# Patient Record
Sex: Female | Born: 1970 | Race: Black or African American | Hispanic: No | Marital: Single | Smoking: Never smoker
Health system: Southern US, Community
[De-identification: ages and names within clinical notes are randomized; demographics above are authoritative.]

## PROBLEM LIST (undated history)

## (undated) DIAGNOSIS — I10 Essential (primary) hypertension: Secondary | ICD-10-CM

## (undated) DIAGNOSIS — C539 Malignant neoplasm of cervix uteri, unspecified: Secondary | ICD-10-CM

---

## 2011-06-27 DIAGNOSIS — I10 Essential (primary) hypertension: Secondary | ICD-10-CM | POA: Diagnosis present

## 2011-06-27 DIAGNOSIS — G4733 Obstructive sleep apnea (adult) (pediatric): Secondary | ICD-10-CM | POA: Diagnosis present

## 2011-07-01 DIAGNOSIS — F419 Anxiety disorder, unspecified: Secondary | ICD-10-CM | POA: Insufficient documentation

## 2012-06-25 DIAGNOSIS — D649 Anemia, unspecified: Secondary | ICD-10-CM | POA: Diagnosis present

## 2012-07-20 DIAGNOSIS — F32A Depression, unspecified: Secondary | ICD-10-CM | POA: Diagnosis present

## 2018-08-18 DIAGNOSIS — Z8541 Personal history of malignant neoplasm of cervix uteri: Secondary | ICD-10-CM | POA: Insufficient documentation

## 2020-03-09 DIAGNOSIS — N182 Chronic kidney disease, stage 2 (mild): Secondary | ICD-10-CM | POA: Diagnosis present

## 2021-11-04 ENCOUNTER — Emergency Department: Payer: Self-pay

## 2021-11-04 ENCOUNTER — Emergency Department
Admission: EM | Admit: 2021-11-04 | Discharge: 2021-11-04 | Disposition: A | Discharge status: Home / Self Care | Payer: Self-pay | Attending: Emergency Medicine | Admitting: Emergency Medicine

## 2021-11-04 ENCOUNTER — Other Ambulatory Visit: Payer: Self-pay

## 2021-11-04 DIAGNOSIS — R600 Localized edema: Secondary | ICD-10-CM | POA: Insufficient documentation

## 2021-11-04 DIAGNOSIS — R609 Edema, unspecified: Secondary | ICD-10-CM

## 2021-11-04 DIAGNOSIS — I10 Essential (primary) hypertension: Secondary | ICD-10-CM | POA: Insufficient documentation

## 2021-11-04 DIAGNOSIS — Z79899 Other long term (current) drug therapy: Secondary | ICD-10-CM | POA: Insufficient documentation

## 2021-11-04 DIAGNOSIS — M7989 Other specified soft tissue disorders: Secondary | ICD-10-CM | POA: Insufficient documentation

## 2021-11-04 HISTORY — DX: Essential (primary) hypertension: I10

## 2021-11-04 LAB — CBC WITH DIFFERENTIAL/PLATELET
Abs Immature Granulocytes: 0.01 10*3/uL (ref 0.00–0.07)
Basophils Absolute: 0 10*3/uL (ref 0.0–0.1)
Basophils Relative: 0 %
Eosinophils Absolute: 0.2 10*3/uL (ref 0.0–0.5)
Eosinophils Relative: 3 %
HCT: 34.8 % — ABNORMAL LOW (ref 36.0–46.0)
Hemoglobin: 11.9 g/dL — ABNORMAL LOW (ref 12.0–15.0)
Immature Granulocytes: 0 %
Lymphocytes Relative: 45 %
Lymphs Abs: 2.9 10*3/uL (ref 0.7–4.0)
MCH: 30.9 pg (ref 26.0–34.0)
MCHC: 34.2 g/dL (ref 30.0–36.0)
MCV: 90.4 fL (ref 80.0–100.0)
Monocytes Absolute: 0.3 10*3/uL (ref 0.1–1.0)
Monocytes Relative: 5 %
Neutro Abs: 3 10*3/uL (ref 1.7–7.7)
Neutrophils Relative %: 47 %
Platelets: 241 10*3/uL (ref 150–400)
RBC: 3.85 MIL/uL — ABNORMAL LOW (ref 3.87–5.11)
RDW: 13.3 % (ref 11.5–15.5)
WBC: 6.4 10*3/uL (ref 4.0–10.5)
nRBC: 0 % (ref 0.0–0.2)

## 2021-11-04 LAB — URINALYSIS, COMPLETE (UACMP) WITH MICROSCOPIC
Bacteria, UA: NONE SEEN
Bilirubin Urine: NEGATIVE
Glucose, UA: NEGATIVE mg/dL
Hgb urine dipstick: NEGATIVE
Ketones, ur: NEGATIVE mg/dL
Nitrite: NEGATIVE
Protein, ur: NEGATIVE mg/dL
Specific Gravity, Urine: 1.024 (ref 1.005–1.030)
pH: 5 (ref 5.0–8.0)

## 2021-11-04 LAB — COMPREHENSIVE METABOLIC PANEL
ALT: 9 U/L (ref 0–44)
AST: 15 U/L (ref 15–41)
Albumin: 3.6 g/dL (ref 3.5–5.0)
Alkaline Phosphatase: 89 U/L (ref 38–126)
Anion gap: 6 (ref 5–15)
BUN: 17 mg/dL (ref 6–20)
CO2: 27 mmol/L (ref 22–32)
Calcium: 8.8 mg/dL — ABNORMAL LOW (ref 8.9–10.3)
Chloride: 106 mmol/L (ref 98–111)
Creatinine, Ser: 0.88 mg/dL (ref 0.44–1.00)
GFR, Estimated: >60 mL/min (ref >60)
Glucose, Bld: 92 mg/dL (ref 70–99)
Potassium: 3.5 mmol/L (ref 3.5–5.1)
Sodium: 139 mmol/L (ref 135–145)
Total Bilirubin: 0.7 mg/dL (ref 0.3–1.2)
Total Protein: 7.3 g/dL (ref 6.5–8.1)

## 2021-11-04 NOTE — ED Notes (Signed)
Increased swelling of the R leg  to thigh vs L leg for approx, 1 week. Pain in the R leg but denies pain L leg. Pt does take Lasix on a regular basis. Did not take any Lasix today. R leg stiffness with walking. No previous hx of DVT. Denies SOB/CP.  A&Ox4. Skin p/w/d. RR even and nonlabored.

## 2021-11-04 NOTE — ED Triage Notes (Signed)
Pt c/o right leg pain and swelling for the past 3 days, denies injury, pt is concerned for a DVT

## 2021-11-04 NOTE — ED Notes (Signed)
Pt left without receiving discharge papers, no notification to staff that she was leaving.

## 2021-11-04 NOTE — ED Provider Notes (Addendum)
[facility] Provider Note  Patient Contact: 8:53 PM (approximate)   History   Leg Pain   HPI  Cassandra Russo is a 51 y.o. female with a history of essential hypertension presents to the emergency department with right lower extremity edema.  Patient states that she has chronic swelling of the bilateral lower extremities and does take a diuretic at home.  Patient has been cautioned that should she experience worsening swelling as she currently has to double her Lasix at home.  Patient states that she has not increased her dosage of Lasix because she does not like to have to urinate often.  She denies falls or mechanisms of trauma.  She denies chest pain, chest tightness, shortness of breath.  No orthopnea or paroxysmal nocturnal dyspnea.      Physical Exam   Triage Vital Signs: ED Triage Vitals  Enc Vitals Group     BP 11/04/21 1856 (!) 188/105     Pulse Rate 11/04/21 1856 79     Resp 11/04/21 1856 19     Temp 11/04/21 1856 98.7 F (37.1 C)     Temp Source 11/04/21 2013 Oral     SpO2 11/04/21 1856 97 %     Weight --      Height --      Head Circumference --      Peak Flow --      Pain Score 11/04/21 2013 4     Pain Loc --      Pain Edu? --      Excl. in [place]? --     Most recent vital signs: Vitals:   11/04/21 1856 11/04/21 2013  BP: (!) 188/105 (!) 162/104  Pulse: 79 78  Resp: 19 18  Temp: 98.7 F (37.1 C) 98.6 F (37 C)  SpO2: 97% 99%     General: Alert and in no acute distress. Eyes:  PERRL. EOMI. Head: No acute traumatic findings ENT:      Nose: No congestion/rhinnorhea.      Mouth/Throat: Mucous membranes are moist. Neck: No stridor. No cervical spine tenderness to palpation. Hematological/Lymphatic/Immunilogical: No cervical lymphadenopathy. Cardiovascular:  Good peripheral perfusion Respiratory: Normal respiratory effort without tachypnea or retractions. Lungs CTAB. Good air entry to the bases with no decreased or absent breath  sounds. Gastrointestinal: Bowel sounds 4 quadrants. Soft and nontender to palpation. No guarding or rigidity. No palpable masses. No distention. No CVA tenderness. Musculoskeletal: Full range of motion to all extremities.  Neurologic:  No gross focal neurologic deficits are appreciated.  Skin:   No rash noted Other:   ED Results / Procedures / Treatments   Labs (all labs ordered are listed, but only abnormal results are displayed) Labs Reviewed  CBC WITH DIFFERENTIAL/PLATELET - Abnormal; Notable for the following components:      Result Value   RBC 3.85 (*)    Hemoglobin 11.9 (*)    HCT 34.8 (*)    All other components within normal limits  COMPREHENSIVE METABOLIC PANEL - Abnormal; Notable for the following components:   Calcium 8.8 (*)    All other components within normal limits  URINALYSIS, COMPLETE (UACMP) WITH MICROSCOPIC - Abnormal; Notable for the following components:   Color, Urine YELLOW (*)    APPearance CLEAR (*)    Leukocytes,Ua SMALL (*)    All other components within normal limits        RADIOLOGY  I personally viewed and evaluated these images as part of my medical decision making,  as well as reviewing the written report by the radiologist.  ED Provider Interpretation: No evidence of DVT in the bilateral lower extremities.       MEDICATIONS ORDERED IN ED: Medications - No data to display   IMPRESSION / MDM / [place] / ED COURSE  I reviewed the triage vital signs and the nursing notes.                              Assessment and Plan:   Differential diagnosis includes, but is not limited to, DVT, acute kidney injury, venous insufficiency, medication side effect,...  51 year old female presents to the emergency department with bilateral lower extremity swelling, right worse than left for the past week.  Patient was hypertensive at triage but vital signs were otherwise reassuring.  Patient had 2+ pitting edema on the right and 1+  pitting edema on the left.  Patient had no erythema.  Venous ultrasound showed no signs of DVT.  CBC and CMP were reassuring.  Recommended increasing diuretic as directed by nephrology.  Last note from nephrology on 04/05/2021 with concern lower extremity edema stemmed from amlodipine, salty food intake and caffeine.  Return precautions were given to return to the emergency department with new or worsening symptoms.  All patient questions were answered.   FINAL CLINICAL IMPRESSION(S) / ED DIAGNOSES   Final diagnoses:  Leg swelling  Peripheral edema     Rx / DC Orders   ED Discharge Orders     None        Note:  This document was prepared using Dragon voice recognition software and may include unintentional dictation errors.   Vallarie Mare [place], PA-C 11/04/21 2244    Lannie Fields, PA-C 11/20/21 1537    Nena Polio, MD 11/21/21 [phone]

## 2022-02-05 ENCOUNTER — Emergency Department
Admission: EM | Admit: 2022-02-05 | Discharge: 2022-02-05 | Disposition: A | Discharge status: Home / Self Care | Payer: Self-pay | Attending: Emergency Medicine | Admitting: Emergency Medicine

## 2022-02-05 ENCOUNTER — Encounter: Payer: Self-pay | Admitting: Emergency Medicine

## 2022-02-05 ENCOUNTER — Emergency Department: Payer: Self-pay

## 2022-02-05 DIAGNOSIS — M79604 Pain in right leg: Secondary | ICD-10-CM | POA: Insufficient documentation

## 2022-02-05 MED ORDER — CLONIDINE HCL 0.1 MG PO TABS
0.1000 mg | ORAL_TABLET | Freq: Once | ORAL | Status: AC
  Administered 2022-02-05: 0.1 mg via ORAL
  Filled 2022-02-05: qty 1

## 2022-02-05 MED ORDER — NAPROXEN 500 MG PO TABS
500.0000 mg | ORAL_TABLET | Freq: Once | ORAL | Status: AC
  Administered 2022-02-05: 500 mg via ORAL
  Filled 2022-02-05: qty 1

## 2022-02-05 MED ORDER — NAPROXEN 500 MG PO TABS: 500.0000 mg | ORAL_TABLET | Freq: Two times a day (BID) | ORAL | 0 refills | Status: DC

## 2022-02-05 NOTE — Discharge Instructions (Signed)
Your x-ray of the right knee and ultrasound of the right leg do not show any acute issues such as fracture or blood clot in the leg.  This appears to be due to chronic degenerative changes of the joint itself and surrounding connective tissues.  Please follow-up with orthopedics for further evaluation. ?

## 2022-02-05 NOTE — ED Triage Notes (Addendum)
Pt ambulatory in triage complaining of right knee pain starting about a week ago. She notes swelling to her legs all day. Of note, the pt is prescribed diuretics but isn't compliant. Denies SOB or CP. ?

## 2022-02-05 NOTE — ED Notes (Addendum)
Pt has a hx of HTN and she took her BP medications PTA. ?

## 2022-02-05 NOTE — ED Provider Notes (Signed)
? ?[facility] ?Provider Note ? ? ? Event Date/Time  ? First MD Initiated Contact with Patient 02/05/22 0710   ?  (approximate) ? ? ?History  ? ?Leg Pain ? ? ?HPI ? ?Bryana Froemming is a 51 y.o. female who complains of right knee pain, gradual onset 1 week ago, waxing and waning, worse with standing and walking, associated with swelling of the leg.  She does note that she has been out of her blood pressure medicine and just got it refilled and restarted this morning.  Denies any chest pain or shortness of breath, no fever.  Denies any recent trauma. ?  ? ? ?Physical Exam  ? ?Triage Vital Signs: ?ED Triage Vitals  ?Enc Vitals Group  ?   BP 02/05/22 0619 (!) 196/94  ?   Pulse Rate 02/05/22 0619 74  ?   Resp 02/05/22 0619 18  ?   Temp 02/05/22 0619 98 ?F (36.7 ?C)  ?   Temp Source 02/05/22 0619 Oral  ?   SpO2 02/05/22 0619 98 %  ?   Weight 02/05/22 0617 217 lb (98.4 kg)  ?   Height 02/05/22 0617 5\' 4"  (1.626 m)  ?   Head Circumference --   ?   Peak Flow --   ?   Pain Score 02/05/22 0617 10  ?   Pain Loc --   ?   Pain Edu? --   ?   Excl. in [place]? --   ? ? ?Most recent vital signs: ?Vitals:  ? 02/05/22 0619 02/05/22 0700  ?BP: (!) 196/94 (!) 196/109  ?Pulse: 74 69  ?Resp: 18 18  ?Temp: 98 ?F (36.7 ?C)   ?SpO2: 98% 99%  ? ? ? ?General: Awake, no distress.  ?CV:  Good peripheral perfusion.  Regular rate rhythm ?Resp:  Normal effort.  Clear to auscultation bilaterally ?Abd:  No distention.  ?Other:  Symmetric calf circumference.  No calf tenderness.  Negative Homans' sign.  There is some tenderness in the popliteal fossa.  No inflammatory changes about the knee.  Full range of motion. ? ? ?ED Results / Procedures / Treatments  ? ?Labs ?(all labs ordered are listed, but only abnormal results are displayed) ?Labs Reviewed - No data to display ? ? ?EKG ? ? ? ? ?RADIOLOGY ?Knee x-ray viewed and interpreted by me, no fracture.  Radiology report reviewed. ? ?Ultrasound right leg negative for  DVT. ? ? ? ?PROCEDURES: ? ?Critical Care performed: No ? ?Procedures ? ? ?MEDICATIONS ORDERED IN ED: ?Medications  ?cloNIDine (CATAPRES) tablet 0.1 mg (0.1 mg Oral Given 02/05/22 02/07/22)  ?naproxen (NAPROSYN) tablet 500 mg (500 mg Oral Given 02/05/22 02/07/22)  ? ? ? ?IMPRESSION / MDM / ASSESSMENT AND PLAN / ED COURSE  ?I reviewed the triage vital signs and the nursing notes. ?             ?               ? ?Differential diagnosis includes, but is not limited to, fracture, arthritis, right knee sprain, right leg DVT ? ? ? ?Patient presents with right leg pain in the thigh and knee.  This is an acute issue.  No evidence of any infection.  She is nontoxic.  She is hypertensive due to medication noncompliance.  We will give a dose of clonidine for now.  She did restart her blood pressure medicine this morning so I expect blood pressure to gradually improve which is the safest course. ? ?  X-ray and ultrasound are negative.  Labs not indicated at this time.  Not requiring admission, will recommend NSAIDs for pain. ?  ? ? ?FINAL CLINICAL IMPRESSION(S) / ED DIAGNOSES  ? ?Final diagnoses:  ?Right leg pain  ? ? ? ?Rx / DC Orders  ? ?ED Discharge Orders   ? ?      Ordered  ?  naproxen (NAPROSYN) 500 MG tablet  2 times daily with meals       ? 02/05/22 0916  ? ?  ?  ? ?  ? ? ? ?Note:  This document was prepared using Dragon voice recognition software and may include unintentional dictation errors. ?  ?Sharman Cheek, MD ?02/05/22 [phone] ? ?

## 2022-08-31 ENCOUNTER — Other Ambulatory Visit: Payer: Self-pay

## 2022-08-31 DIAGNOSIS — N950 Postmenopausal bleeding: Secondary | ICD-10-CM | POA: Insufficient documentation

## 2022-08-31 DIAGNOSIS — M545 Low back pain, unspecified: Secondary | ICD-10-CM | POA: Insufficient documentation

## 2022-08-31 DIAGNOSIS — R102 Pelvic and perineal pain: Secondary | ICD-10-CM | POA: Insufficient documentation

## 2022-08-31 NOTE — ED Triage Notes (Signed)
Patient states she is having lower back pain. Patient thought is was related to gas and took dulcolax. Patient states is unrelieved with pain 10/10. No trouble voiding.

## 2022-09-01 ENCOUNTER — Emergency Department: Payer: Self-pay

## 2022-09-01 ENCOUNTER — Emergency Department
Admission: EM | Admit: 2022-09-01 | Discharge: 2022-09-01 | Disposition: A | Discharge status: Home / Self Care | Payer: Self-pay | Attending: Emergency Medicine | Admitting: Emergency Medicine

## 2022-09-01 DIAGNOSIS — N950 Postmenopausal bleeding: Secondary | ICD-10-CM

## 2022-09-01 DIAGNOSIS — R102 Pelvic and perineal pain: Secondary | ICD-10-CM

## 2022-09-01 DIAGNOSIS — M545 Low back pain, unspecified: Secondary | ICD-10-CM

## 2022-09-01 LAB — BASIC METABOLIC PANEL
Anion gap: 7 (ref 5–15)
BUN: 19 mg/dL (ref 6–20)
CO2: 25 mmol/L (ref 22–32)
Calcium: 9.3 mg/dL (ref 8.9–10.3)
Chloride: 110 mmol/L (ref 98–111)
Creatinine, Ser: 0.9 mg/dL (ref 0.44–1.00)
GFR, Estimated: >60 mL/min (ref >60)
Glucose, Bld: 99 mg/dL (ref 70–99)
Potassium: 4.1 mmol/L (ref 3.5–5.1)
Sodium: 142 mmol/L (ref 135–145)

## 2022-09-01 LAB — CBC WITH DIFFERENTIAL/PLATELET
Abs Immature Granulocytes: 0.02 10*3/uL (ref 0.00–0.07)
Basophils Absolute: 0 10*3/uL (ref 0.0–0.1)
Basophils Relative: 1 %
Eosinophils Absolute: 0.1 10*3/uL (ref 0.0–0.5)
Eosinophils Relative: 3 %
HCT: 35.3 % — ABNORMAL LOW (ref 36.0–46.0)
Hemoglobin: 11.7 g/dL — ABNORMAL LOW (ref 12.0–15.0)
Immature Granulocytes: 0 %
Lymphocytes Relative: 46 %
Lymphs Abs: 2.6 10*3/uL (ref 0.7–4.0)
MCH: 30.1 pg (ref 26.0–34.0)
MCHC: 33.1 g/dL (ref 30.0–36.0)
MCV: 90.7 fL (ref 80.0–100.0)
Monocytes Absolute: 0.4 10*3/uL (ref 0.1–1.0)
Monocytes Relative: 6 %
Neutro Abs: 2.5 10*3/uL (ref 1.7–7.7)
Neutrophils Relative %: 44 %
Platelets: 265 10*3/uL (ref 150–400)
RBC: 3.89 MIL/uL (ref 3.87–5.11)
RDW: 12.8 % (ref 11.5–15.5)
WBC: 5.7 10*3/uL (ref 4.0–10.5)
nRBC: 0 % (ref 0.0–0.2)

## 2022-09-01 MED ORDER — KETOROLAC TROMETHAMINE 30 MG/ML IJ SOLN
15.0000 mg | Freq: Once | INTRAMUSCULAR | Status: AC
  Filled 2022-09-01: qty 1

## 2022-09-01 MED ORDER — HYDROCODONE-ACETAMINOPHEN 5-325 MG PO TABS: 2.0000 | ORAL_TABLET | Freq: Four times a day (QID) | ORAL | 0 refills | Status: DC | PRN

## 2022-09-01 MED ORDER — KETOROLAC TROMETHAMINE 30 MG/ML IJ SOLN
INTRAMUSCULAR | Status: AC
  Administered 2022-09-01: 15 mg via INTRAVENOUS
  Filled 2022-09-01: qty 1

## 2022-09-01 NOTE — Discharge Instructions (Addendum)
As we discussed, your evaluation was generally reassuring.  However you do have some fluid present in the endometrial canal on ultrasound, and we feel that it is appropriate for you to follow-up with a GYN specialist or with your primary care provider.  You were given the contact information for GYN specialist at [facility] here in [place] and you can call to schedule follow-up appointment.  Explained that you are having some pelvic pain and postmenopausal vaginal bleeding, and that the emergency room physician recommended that you call to follow-up.  Try taking over-the-counter ibuprofen and/or Tylenol as needed according to label instructions for the pain.  Take Norco as prescribed for severe pain. Do not drink alcohol, drive or participate in any other potentially dangerous activities while taking this medication as it may make you sleepy. Do not take this medication with any other sedating medications, either prescription or over-the-counter. If you were prescribed Percocet or Vicodin, do not take these with acetaminophen (Tylenol) as it is already contained within these medications.   This medication is an opiate (or narcotic) pain medication and can be habit forming.  Use it as little as possible to achieve adequate pain control.  Do not use or use it with extreme caution if you have a history of opiate abuse or dependence.  If you are on a pain contract with your primary care doctor or a pain specialist, be sure to let them know you were prescribed this medication today from the [facility].  This medication is intended for your use only - do not give any to anyone else and keep it in a secure place where nobody else, especially children, have access to it.  It will also cause or worsen constipation, so you may want to consider taking an over-the-counter stool softener while you are taking this medication.    Return to the emergency department if you develop new or  worsening symptoms that concern you.

## 2022-09-01 NOTE — ED Provider Notes (Signed)
[facility] Provider Note    Event Date/Time   First MD Initiated Contact with Patient 09/01/22 0016     (approximate)   History   Back Pain   HPI  Kynadee Dam is a 51 y.o. female who presents for evaluation of about a week of pain in the center of her lower abdomen as well as in her lower back.  At first she thought she was constipated so she started taking Dulcolax, but even after having bowel movements it did not seem to help.  She was also feeling gassy and took Gas-X but that did not seem to help.  She has not had any nausea or vomiting.  No upper abdominal pain.  No chest pain or shortness of breath.  She said it feels like menstrual cramps and comes in waves.  She said that for the last week she has also had a small amount of vaginal bleeding, which is unusual because she is postmenopausal.  She has no other vaginal complaints or concerns.  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  She reports no dysuria, no increased urinary frequency, and no hematuria.     Physical Exam   Triage Vital Signs: ED Triage Vitals  Enc Vitals Group     BP 08/31/22 2259 (!) 164/100     Pulse Rate 08/31/22 2259 66     Resp 08/31/22 2259 17     Temp 08/31/22 2259 98.4 F (36.9 C)     Temp Source 08/31/22 2259 Oral     SpO2 08/31/22 2259 96 %     Weight 08/31/22 2300 97.1 kg (214 lb)     Height 08/31/22 2300 1.651 m (5\' 5" )     Head Circumference --      Peak Flow --      Pain Score 08/31/22 2259 10     Pain Loc --      Pain Edu? --      Excl. in [place]? --     Most recent vital signs: Vitals:   08/31/22 2259 09/01/22 0351  BP: (!) 164/100 (!) 165/97  Pulse: 66 (!) 57  Resp: 17 18  Temp: 98.4 F (36.9 C)   SpO2: 96% 98%     General: Awake, no distress.  CV:  Good peripheral perfusion.  Resp:  Normal effort.  Abd:  No tenderness to palpation of the abdomen in any quadrant.  No  rebound or guarding.  No distention. Other:  No tenderness to palpation or percussion of the patient's lower back and flanks.   ED Results / Procedures / Treatments   Labs (all labs ordered are listed, but only abnormal results are displayed) Labs Reviewed  CBC WITH DIFFERENTIAL/PLATELET - Abnormal; Notable for the following components:      Result Value   Hemoglobin 11.7 (*)    HCT 35.3 (*)    All other components within normal limits  BASIC METABOLIC PANEL     RADIOLOGY See hospital course for details: Pelvic ultrasound revealed some fluid in the endometrial canal but no gross abnormalities or evidence of masses/tumors.    PROCEDURES:  Critical Care performed: No  Procedures   MEDICATIONS ORDERED IN ED: Medications  ketorolac (TORADOL) 30 MG/ML injection 15 mg (15 mg Intravenous Given 09/01/22 0112)     IMPRESSION / MDM / ASSESSMENT AND PLAN / ED COURSE  I reviewed the triage vital signs and the  nursing notes.                              Differential diagnosis includes, but is not limited to, pelvic mass, endometrial or cervical cancer, UTI/pyelonephritis, renal stone, much less likely SBO/ileus.  Patient's presentation is most consistent with acute presentation with potential threat to life or bodily function.  Labs/studies ordered: Basic metabolic panel, CBC with differential, pelvic ultrasound with transvaginal and torsion rule out.  The patient is having no urinary symptoms and she has no infectious signs or symptoms.  CBC is pending and basic metabolic panel is normal.  She has no tenderness to palpation of the abdomen at all, including to suprapubic palpation, but she is having menstrual cramp-like sensations in the pelvis that is radiating to her back.  I considered CT scan of the abdomen and pelvis, but given her postmenopausal vaginal bleeding and the nature of her symptoms, as well as her history of radiation to treat cervical lesion (if not cervical  cancer), I talked with her about getting a pelvic ultrasound with transvaginal evaluation.  She agrees with the plan.  Given that she has no tenderness but ongoing cramps, I think it is reasonable if the ultrasound is generally reassuring that she follow-up with GYN for additional evaluation and possibly including endometrial biopsy.  She says that she understands.    Clinical Course as of 09/01/22 0729  Mon Sep 01, 2022  0117 CBC with Differential(!) Normal CBC [CF]  3016 US PELVIC COMPLETE W TRANSVAGINAL AND TORSION R/O I viewed and interpreted the patient's ultrasound.  I cannot see any obvious masses or acute abnormality to explain patient's pain.  However the radiology report indicates that there is some fluid present.  Given that she is having some pelvic pain/cramping and postmenopausal vaginal spotting, I feel that she needs GYN follow-up.  I talked with her about the results and she agrees to follow-up but she said that she would prefer to follow-up with her primary care provider, try to family practice and [place].  I told her that is fine but I also gave her the name and number of Dr. Dalbert Garnet with [facility] and encouraged her to consider following up with both.  I checked the [place] controlled substance database and verified that there are no concerning prescribing habits, so I wrote her a short course of Norco.  She will also continue using her stool softener.  I gave my usual and customary return precautions. [CF]    Clinical Course User Index [CF] Loleta Rose, MD     FINAL CLINICAL IMPRESSION(S) / ED DIAGNOSES   Final diagnoses:  Pelvic pain  Acute bilateral low back pain without sciatica  Postmenopausal vaginal bleeding     Rx / DC Orders   ED Discharge Orders          Ordered    HYDROcodone-acetaminophen (NORCO/VICODIN) 5-325 MG tablet  Every 6 hours PRN        09/01/22 0342             Note:  This document was prepared using Dragon  voice recognition software and may include unintentional dictation errors.   Loleta Rose, MD 09/01/22 [phone]

## 2022-12-02 DIAGNOSIS — K632 Fistula of intestine: Secondary | ICD-10-CM | POA: Diagnosis present

## 2023-01-31 DIAGNOSIS — L03311 Cellulitis of abdominal wall: Secondary | ICD-10-CM | POA: Diagnosis not present

## 2023-01-31 DIAGNOSIS — B372 Candidiasis of skin and nail: Secondary | ICD-10-CM | POA: Insufficient documentation

## 2023-01-31 DIAGNOSIS — I10 Essential (primary) hypertension: Secondary | ICD-10-CM | POA: Diagnosis not present

## 2023-01-31 DIAGNOSIS — Z8541 Personal history of malignant neoplasm of cervix uteri: Secondary | ICD-10-CM | POA: Insufficient documentation

## 2023-01-31 DIAGNOSIS — R21 Rash and other nonspecific skin eruption: Secondary | ICD-10-CM | POA: Diagnosis present

## 2023-02-01 ENCOUNTER — Emergency Department
Admission: EM | Admit: 2023-02-01 | Discharge: 2023-02-01 | Disposition: A | Discharge status: Home / Self Care | Payer: 59 | Attending: Emergency Medicine | Admitting: Emergency Medicine

## 2023-02-01 ENCOUNTER — Other Ambulatory Visit: Payer: Self-pay

## 2023-02-01 DIAGNOSIS — B372 Candidiasis of skin and nail: Secondary | ICD-10-CM

## 2023-02-01 DIAGNOSIS — R238 Other skin changes: Secondary | ICD-10-CM

## 2023-02-01 DIAGNOSIS — L03311 Cellulitis of abdominal wall: Secondary | ICD-10-CM

## 2023-02-01 MED ORDER — SULFAMETHOXAZOLE-TRIMETHOPRIM 800-160 MG PO TABS
1.0000 | ORAL_TABLET | Freq: Once | ORAL | Status: AC
  Administered 2023-02-01: 1 via ORAL
  Filled 2023-02-01: qty 1

## 2023-02-01 MED ORDER — ONDANSETRON 4 MG PO TBDP: 4.0000 mg | ORAL_TABLET | Freq: Four times a day (QID) | ORAL | 0 refills | Status: DC | PRN

## 2023-02-01 MED ORDER — OXYCODONE-ACETAMINOPHEN 5-325 MG PO TABS: 2.0000 | ORAL_TABLET | Freq: Four times a day (QID) | ORAL | 0 refills | Status: DC | PRN

## 2023-02-01 MED ORDER — OXYCODONE-ACETAMINOPHEN 5-325 MG PO TABS
1.0000 | ORAL_TABLET | Freq: Once | ORAL | Status: AC
  Administered 2023-02-01: 1 via ORAL
  Filled 2023-02-01: qty 1

## 2023-02-01 MED ORDER — SULFAMETHOXAZOLE-TRIMETHOPRIM 800-160 MG PO TABS: 1.0000 | ORAL_TABLET | Freq: Two times a day (BID) | ORAL | 0 refills | Status: DC

## 2023-02-01 MED ORDER — ZINC OXIDE 40 % EX OINT
1.0000 | TOPICAL_OINTMENT | CUTANEOUS | Status: DC | PRN
  Administered 2023-02-01: 1 via TOPICAL
  Filled 2023-02-01: qty 113

## 2023-02-01 MED ORDER — NYSTATIN-TRIAMCINOLONE 100000-0.1 UNIT/GM-% EX OINT: 1.0000 | TOPICAL_OINTMENT | Freq: Two times a day (BID) | CUTANEOUS | 1 refills | Status: DC

## 2023-02-01 MED ORDER — NYSTATIN-TRIAMCINOLONE 100000-0.1 UNIT/GM-% EX OINT
TOPICAL_OINTMENT | Freq: Once | CUTANEOUS | Status: AC
  Filled 2023-02-01: qty 15

## 2023-02-01 MED ORDER — NYSTATIN 100000 UNIT/GM EX POWD
Freq: Once | CUTANEOUS | Status: DC
  Filled 2023-02-01: qty 15

## 2023-02-01 NOTE — ED Provider Notes (Signed)
[facility] Provider Note    Event Date/Time   First MD Initiated Contact with Patient 02/01/23 [phone]     (approximate)   History   Rash and Ostomy Issues   HPI  Cassandra Russo is a 52 y.o. female with history of hypertension, cervical cancer, enterocutaneous fistulas, urostomy who presents to the emergency department with complaints of redness, warmth and irritation to the skin of her abdomen and pubic area as well as her inner thighs.  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  She states she is being seen by wound care at [facility] and they have tried to place a colostomy bag over the largest 1 but it does not fit.  She states she has also had issues with her urostomy leaking.  This is caused her skin to have superficial areas of breakdown that cause her pain and itching.  No fever.   History provided by patient, husband.    Past Medical History:  Diagnosis Date   Hypertension     History reviewed. No pertinent surgical history.  MEDICATIONS:  Prior to Admission medications   Medication Sig Start Date End Date Taking? Authorizing Provider  nystatin-triamcinolone ointment (MYCOLOG) Apply 1 Application topically 2 (two) times daily. 02/01/23  Yes Dachelle Molzahn, Layla Maw, DO  sulfamethoxazole-trimethoprim (BACTRIM DS) 800-160 MG tablet Take 1 tablet by mouth 2 (two) times daily. 02/01/23  Yes Daryl Quiros, Layla Maw, DO  HYDROcodone-acetaminophen (NORCO/VICODIN) 5-325 MG tablet Take 2 tablets by mouth every 6 (six) hours as needed for moderate pain or severe pain. 09/01/22   Loleta Rose, MD  metoprolol succinate (TOPROL-XL) 100 MG 24 hr tablet Take 100 mg by mouth daily. 12/20/21   [provider]  naproxen (NAPROSYN) 500 MG tablet Take 1 tablet (500 mg total) by mouth 2 (two) times daily with a meal. 02/05/22   Sharman Cheek, MD  spironolactone (ALDACTONE) 25 MG tablet Take 25 mg by mouth daily. 12/20/21   [provider]     Physical Exam   Triage Vital Signs: ED Triage Vitals  Enc Vitals Group     BP 02/01/23 0041 (!) 162/87     Pulse Rate 02/01/23 0041 (!) 110     Resp 02/01/23 0041 15     Temp 02/01/23 0041 99 F (37.2 C)     Temp Source 02/01/23 0041 Oral     SpO2 02/01/23 0041 97 %     Weight 02/01/23 0042 195 lb (88.5 kg)     Height 02/01/23 0042  (1.651 m)     Head Circumference --      Peak Flow --      Pain Score 02/01/23 0041 7     Pain Loc --      Pain Edu? --      Excl. in GC? --     Most recent vital signs: Vitals:   02/01/23 0041 02/01/23 0600  BP: (!) 162/87 (!) 150/80  Pulse: (!) 110 95  Resp: 15 16  Temp: 99 F (37.2 C)   SpO2: 97% 97%    CONSTITUTIONAL: Alert, responds appropriately to questions.  Tearful, appears uncomfortable HEAD: Normocephalic, atraumatic EYES: Conjunctivae clear, pupils appear equal, sclera nonicteric ENT: normal nose; moist mucous membranes NECK: Supple, normal ROM CARD: Regular and tachycardic; S1 and S2 appreciated RESP: Normal chest excursion without splinting or tachypnea; breath sounds clear and equal bilaterally; no wheezes, no rhonchi, no rales, no hypoxia or respiratory distress, speaking full sentences ABD/GI:  Non-distended; soft, patient has no tenderness to deep palpation as most of her tenderness seems to be superficial over the skin.  The majority of the skin on the abdomen is red, warm and intermittently indurated.  There is no fluctuance.  No necrosis.  She has redness and excoriations intermittently on the abdominal wall and this also extends into the pubic area, mons and inner thighs.  She has 3 enterocutaneous fistulas noted.  She has a urostomy in the right lower abdomen. BACK: The back appears normal EXT: Normal ROM in all joints; no deformity noted, no edema SKIN: Normal color for age and race; warm; no rash on exposed skin NEURO: Moves all extremities equally, normal speech PSYCH: The patient's mood and manner are  appropriate.   ED Results / Procedures / Treatments   LABS: (all labs ordered are listed, but only abnormal results are displayed) Labs Reviewed - No data to display   EKG:  RADIOLOGY: My personal review and interpretation of imaging:    I have personally reviewed all radiology reports.   No results found.   PROCEDURES:  Critical Care performed: No     Procedures    IMPRESSION / MDM / ASSESSMENT AND PLAN / ED COURSE  I reviewed the triage vital signs and the nursing notes.    Patient here with abdominal wall cellulitis and skin breakdown with likely superimposed yeast infection from drainage of from her enterocutaneous fistulas onto her abdominal skin.     DIFFERENTIAL DIAGNOSIS (includes but not limited to):   Patient here with what appears to be areas of cellulitis to the abdomen as well as possible superimposed yeast infection and skin breakdown.  Discussed with patient that this essentially looks like a diaper rash.   Patient's presentation is most consistent with severe exacerbation of chronic illness.   PLAN: Will start patient on Bactrim to cover areas that look like developing abdominal wall cellulitis.  She has no fever.  She is slightly tachycardic but this has improved on reevaluation.  She does not appear toxic or septic.  She also has areas that look like may be superimposed yeast infection and areas of excoriation where she has been scratching.  Will start her on nystatin and triamcinolone as well.  Recommended that after she placed the nystatin and triamcinolone that she use a barrier cream to help protect her skin.   Stressed the importance of keeping the skin clean and dry.  We will replace her urostomy here and place gauze over her fistulas and have recommended she change these regularly.   She has wound care at [facility] for follow-up and has regular home health nursing.  She is comfortable with plan for outpatient follow-up.   Will discharge  with pain medication.   MEDICATIONS GIVEN IN ED: Medications  liver oil-zinc oxide (DESITIN) 40 % ointment 1 Application (1 Application Topical Given 02/01/23 0445)  sulfamethoxazole-trimethoprim (BACTRIM DS) 800-160 MG per tablet 1 tablet (1 tablet Oral Given 02/01/23 0410)  oxyCODONE-acetaminophen (PERCOCET/ROXICET) 5-325 MG per tablet 1 tablet (1 tablet Oral Given 02/01/23 0410)  nystatin-triamcinolone ointment (MYCOLOG) ( Topical Given 02/01/23 0445)     ED COURSE:  At this time, I do not feel there is any life-threatening condition present. I reviewed all nursing notes, vitals, pertinent previous records.  All lab and urine results, EKGs, imaging ordered have been independently reviewed and interpreted by myself.  I reviewed all available radiology reports from any imaging ordered this visit.  Based on my assessment, I feel  the patient is safe to be discharged home without further emergent workup and can continue workup as an outpatient as needed. Discussed all findings, treatment plan as well as usual and customary return precautions.  They verbalize understanding and are comfortable with this plan.  Outpatient follow-up has been provided as needed.  All questions have been answered.    CONSULTS: Admission considered but I feel she is appropriate for further outpatient management and workup.  Patient is aware if symptoms are worsening despite oral antibiotics or topical medications or she develops fevers that she should return to the emergency department.   OUTSIDE RECORDS REVIEWED: Reviewed multiple previous [facility] notes.       FINAL CLINICAL IMPRESSION(S) / ED DIAGNOSES   Final diagnoses:  Abdominal wall cellulitis  Skin breakdown  Yeast infection of the skin     Rx / DC Orders   ED Discharge Orders          Ordered    nystatin-triamcinolone ointment (MYCOLOG)  2 times daily        02/01/23 0349    sulfamethoxazole-trimethoprim (BACTRIM DS) 800-160 MG tablet  2 times daily         02/01/23 0349    oxyCODONE-acetaminophen (PERCOCET) 5-325 MG tablet  Every 6 hours PRN        02/01/23 0621    ondansetron (ZOFRAN-ODT) 4 MG disintegrating tablet  Every 6 hours PRN        02/01/23 6962             Note:  This document was prepared using Dragon voice recognition software and may include unintentional dictation errors.   Myldred Raju, Layla Maw, DO 02/01/23 [phone]

## 2023-02-01 NOTE — Discharge Instructions (Addendum)
I recommend that you apply nystatin triamcinolone ointment to your inner thighs and around your abdomen twice daily and then apply over-the-counter barrier cream (such as Aquaphor or Desitin with zinc oxide) at least twice daily to keep the skin protected from leakage from ostomy and fistula drainage.  You may also put gauze around these areas to absorb some of this liquid and change these regularly to help keep the skin clean and dry.  This will help with skin healing and inflammation.  Please take your antibiotics as prescribed until complete to help with superimposed infection.  You may use over-the-counter Benadryl as needed for itching.  You may use over-the-counter Tylenol as needed for pain.  Please follow-up with your outpatient doctors at [facility] especially [facility] wound care.

## 2023-02-01 NOTE — ED Notes (Signed)
PT brought to ed rm 1 at this time, this RN now assuming care.  

## 2023-02-01 NOTE — ED Triage Notes (Signed)
Pt has urostomy and colostomy and states that after ostomy change today she has had burning and redness of skin around ostomy bag/wafers.

## 2023-02-01 NOTE — ED Notes (Signed)
Pt refusing BP at this time, sts too tight and it hurts. MD aware.

## 2023-02-20 ENCOUNTER — Other Ambulatory Visit: Payer: Self-pay

## 2023-02-20 ENCOUNTER — Emergency Department: Payer: 59

## 2023-02-20 ENCOUNTER — Inpatient Hospital Stay
Admission: EM | Admit: 2023-02-20 | Discharge: 2023-02-22 | DRG: 863 | Disposition: A | Discharge status: Home Health | Payer: 59 | Attending: Osteopathic Medicine | Admitting: Osteopathic Medicine

## 2023-02-20 ENCOUNTER — Encounter: Payer: Self-pay | Admitting: Emergency Medicine

## 2023-02-20 DIAGNOSIS — G4733 Obstructive sleep apnea (adult) (pediatric): Secondary | ICD-10-CM | POA: Diagnosis present

## 2023-02-20 DIAGNOSIS — Z791 Long term (current) use of non-steroidal anti-inflammatories (NSAID): Secondary | ICD-10-CM

## 2023-02-20 DIAGNOSIS — L989 Disorder of the skin and subcutaneous tissue, unspecified: Secondary | ICD-10-CM | POA: Diagnosis present

## 2023-02-20 DIAGNOSIS — I129 Hypertensive chronic kidney disease with stage 1 through stage 4 chronic kidney disease, or unspecified chronic kidney disease: Secondary | ICD-10-CM | POA: Diagnosis present

## 2023-02-20 DIAGNOSIS — F32A Depression, unspecified: Secondary | ICD-10-CM | POA: Diagnosis present

## 2023-02-20 DIAGNOSIS — Y838 Other surgical procedures as the cause of abnormal reaction of the patient, or of later complication, without mention of misadventure at the time of the procedure: Secondary | ICD-10-CM | POA: Diagnosis present

## 2023-02-20 DIAGNOSIS — D638 Anemia in other chronic diseases classified elsewhere: Secondary | ICD-10-CM | POA: Diagnosis present

## 2023-02-20 DIAGNOSIS — L03311 Cellulitis of abdominal wall: Secondary | ICD-10-CM | POA: Diagnosis present

## 2023-02-20 DIAGNOSIS — Z888 Allergy status to other drugs, medicaments and biological substances status: Secondary | ICD-10-CM

## 2023-02-20 DIAGNOSIS — Z79899 Other long term (current) drug therapy: Secondary | ICD-10-CM

## 2023-02-20 DIAGNOSIS — K632 Fistula of intestine: Secondary | ICD-10-CM | POA: Diagnosis present

## 2023-02-20 DIAGNOSIS — R1084 Generalized abdominal pain: Principal | ICD-10-CM

## 2023-02-20 DIAGNOSIS — I10 Essential (primary) hypertension: Secondary | ICD-10-CM | POA: Diagnosis present

## 2023-02-20 DIAGNOSIS — Z792 Long term (current) use of antibiotics: Secondary | ICD-10-CM

## 2023-02-20 DIAGNOSIS — T8143XA Infection following a procedure, organ and space surgical site, initial encounter: Principal | ICD-10-CM | POA: Diagnosis present

## 2023-02-20 DIAGNOSIS — Z936 Other artificial openings of urinary tract status: Secondary | ICD-10-CM

## 2023-02-20 DIAGNOSIS — L089 Local infection of the skin and subcutaneous tissue, unspecified: Secondary | ICD-10-CM

## 2023-02-20 DIAGNOSIS — Z8541 Personal history of malignant neoplasm of cervix uteri: Secondary | ICD-10-CM

## 2023-02-20 DIAGNOSIS — D649 Anemia, unspecified: Secondary | ICD-10-CM | POA: Diagnosis present

## 2023-02-20 DIAGNOSIS — N182 Chronic kidney disease, stage 2 (mild): Secondary | ICD-10-CM | POA: Diagnosis present

## 2023-02-20 HISTORY — DX: Malignant neoplasm of cervix uteri, unspecified: C53.9

## 2023-02-20 LAB — CBC WITH DIFFERENTIAL/PLATELET
Abs Immature Granulocytes: 0.02 10*3/uL (ref 0.00–0.07)
Basophils Absolute: 0 10*3/uL (ref 0.0–0.1)
Basophils Relative: 0 %
Eosinophils Absolute: 0.2 10*3/uL (ref 0.0–0.5)
Eosinophils Relative: 3 %
HCT: 28.7 % — ABNORMAL LOW (ref 36.0–46.0)
Hemoglobin: 9.1 g/dL — ABNORMAL LOW (ref 12.0–15.0)
Immature Granulocytes: 0 %
Lymphocytes Relative: 34 %
Lymphs Abs: 2.2 10*3/uL (ref 0.7–4.0)
MCH: 29.1 pg (ref 26.0–34.0)
MCHC: 31.7 g/dL (ref 30.0–36.0)
MCV: 91.7 fL (ref 80.0–100.0)
Monocytes Absolute: 0.3 10*3/uL (ref 0.1–1.0)
Monocytes Relative: 5 %
Neutro Abs: 3.7 10*3/uL (ref 1.7–7.7)
Neutrophils Relative %: 58 %
Platelets: 444 10*3/uL — ABNORMAL HIGH (ref 150–400)
RBC: 3.13 MIL/uL — ABNORMAL LOW (ref 3.87–5.11)
RDW: 15.1 % (ref 11.5–15.5)
WBC: 6.4 10*3/uL (ref 4.0–10.5)
nRBC: 0 % (ref 0.0–0.2)

## 2023-02-20 LAB — URINALYSIS, ROUTINE W REFLEX MICROSCOPIC
Bilirubin Urine: NEGATIVE
Glucose, UA: NEGATIVE mg/dL
Ketones, ur: NEGATIVE mg/dL
Leukocytes,Ua: NEGATIVE
Nitrite: NEGATIVE
Protein, ur: 30 mg/dL — AB
RBC / HPF: >50 RBC/hpf (ref 0–5)
Specific Gravity, Urine: 1.014 (ref 1.005–1.030)
Squamous Epithelial / HPF: NONE SEEN /HPF (ref 0–5)
pH: 6 (ref 5.0–8.0)

## 2023-02-20 LAB — LACTIC ACID, PLASMA: Lactic Acid, Venous: 1.1 mmol/L (ref 0.5–1.9)

## 2023-02-20 LAB — COMPREHENSIVE METABOLIC PANEL
ALT: 33 U/L (ref 0–44)
AST: 31 U/L (ref 15–41)
Albumin: 2.8 g/dL — ABNORMAL LOW (ref 3.5–5.0)
Alkaline Phosphatase: 220 U/L — ABNORMAL HIGH (ref 38–126)
Anion gap: 11 (ref 5–15)
BUN: 41 mg/dL — ABNORMAL HIGH (ref 6–20)
CO2: 24 mmol/L (ref 22–32)
Calcium: 9.1 mg/dL (ref 8.9–10.3)
Chloride: 102 mmol/L (ref 98–111)
Creatinine, Ser: 1.01 mg/dL — ABNORMAL HIGH (ref 0.44–1.00)
GFR, Estimated: >60 mL/min (ref >60)
Glucose, Bld: 93 mg/dL (ref 70–99)
Potassium: 4.2 mmol/L (ref 3.5–5.1)
Sodium: 137 mmol/L (ref 135–145)
Total Bilirubin: 0.7 mg/dL (ref 0.3–1.2)
Total Protein: 7.8 g/dL (ref 6.5–8.1)

## 2023-02-20 LAB — PROTIME-INR
INR: 1.1 (ref 0.8–1.2)
Prothrombin Time: 14 seconds (ref 11.4–15.2)

## 2023-02-20 MED ORDER — SODIUM CHLORIDE 0.9 % IV BOLUS
1000.0000 mL | Freq: Once | INTRAVENOUS | Status: AC
  Administered 2023-02-20: 1000 mL via INTRAVENOUS

## 2023-02-20 MED ORDER — SODIUM CHLORIDE 0.9 % IV SOLN
2.0000 g | Freq: Once | INTRAVENOUS | Status: AC
  Administered 2023-02-20: 2 g via INTRAVENOUS
  Filled 2023-02-20: qty 12.5

## 2023-02-20 MED ORDER — HYDROMORPHONE HCL 1 MG/ML IJ SOLN
1.0000 mg | Freq: Once | INTRAMUSCULAR | Status: AC
  Administered 2023-02-20: 1 mg via INTRAVENOUS
  Filled 2023-02-20: qty 1

## 2023-02-20 MED ORDER — SODIUM CHLORIDE 0.9 % IV BOLUS
1000.0000 mL | Freq: Once | INTRAVENOUS | Status: AC
  Administered 2023-02-21: 1000 mL via INTRAVENOUS

## 2023-02-20 MED ORDER — IOHEXOL 300 MG/ML  SOLN
100.0000 mL | Freq: Once | INTRAMUSCULAR | Status: AC | PRN
  Administered 2023-02-20: 100 mL via INTRAVENOUS

## 2023-02-20 MED ORDER — VANCOMYCIN HCL 2000 MG/400ML IV SOLN
2000.0000 mg | Freq: Once | INTRAVENOUS | Status: AC
  Administered 2023-02-20: 2000 mg via INTRAVENOUS
  Filled 2023-02-20: qty 400

## 2023-02-20 MED ORDER — METRONIDAZOLE 500 MG/100ML IV SOLN
500.0000 mg | Freq: Once | INTRAVENOUS | Status: AC
  Administered 2023-02-20: 500 mg via INTRAVENOUS
  Filled 2023-02-20: qty 100

## 2023-02-20 NOTE — ED Provider Notes (Signed)
[facility] Provider Note    Event Date/Time   First MD Initiated Contact with Patient 02/20/23 2114     (approximate)   History   Wound Check   HPI  Cassandra Russo is a 52 y.o. female with history of cervical cancer, enterocutaneous fistula of her abdomen, here with abdominal pain, nausea, fatigue.  The patient states that over the last several days, she has had progressively worsening aching, throbbing, severe pain along her midline abdominal scar.  She has had increased drainage.  She has also had general fatigue.  She is unable to control the pain with analgesia.  She subsequent presents for evaluation.     Physical Exam   Triage Vital Signs: ED Triage Vitals  Enc Vitals Group     BP 02/20/23 1805 (!) 185/136     Pulse Rate 02/20/23 1805 (!) 113     Resp 02/20/23 1805 19     Temp 02/20/23 1805 99.3 F (37.4 C)     Temp Source 02/20/23 1805 Oral     SpO2 02/20/23 1805 100 %     Weight 02/20/23 1806 190 lb (86.2 kg)     Height 02/20/23 1806 5\' 5"  (1.651 m)     Head Circumference --      Peak Flow --      Pain Score --      Pain Loc --      Pain Edu? --      Excl. in GC? --     Most recent vital signs: Vitals:   02/20/23 2245 02/21/23 0015  BP: (!) 155/85 (!) 160/94  Pulse: (!) 111   Resp:    Temp:    SpO2: 97%      General: Awake, no distress.  CV:  Good peripheral perfusion.  Regular rate and rhythm. Resp:  Normal work of breathing.  Lungs clear. Abd:  No distention.  Multiple open, draining wounds along the midline abdomen, draining feculent material.  There is a small area draining of purulent material along the inferior most aspect of the wound as well.  Diffuse, severe tenderness to palpation with some slight skin induration.  Right diverting urostomy in place. Other:  Appears uncomfortable.   ED Results / Procedures / Treatments   Labs (all labs ordered are listed, but only abnormal results are displayed) Labs Reviewed   COMPREHENSIVE METABOLIC PANEL - Abnormal; Notable for the following components:      Result Value   BUN 41 (*)    Creatinine, Ser 1.01 (*)    Albumin 2.8 (*)    Alkaline Phosphatase 220 (*)    All other components within normal limits  CBC WITH DIFFERENTIAL/PLATELET - Abnormal; Notable for the following components:   RBC 3.13 (*)    Hemoglobin 9.1 (*)    HCT 28.7 (*)    Platelets 444 (*)    All other components within normal limits  URINALYSIS, ROUTINE W REFLEX MICROSCOPIC - Abnormal; Notable for the following components:   Color, Urine YELLOW (*)    APPearance HAZY (*)    Hgb urine dipstick MODERATE (*)    Protein, ur 30 (*)    Bacteria, UA MANY (*)    All other components within normal limits  CULTURE, BLOOD (ROUTINE X 2)  CULTURE, BLOOD (ROUTINE X 2)  LACTIC ACID, PLASMA  PROTIME-INR  LACTIC ACID, PLASMA  COMPREHENSIVE METABOLIC PANEL  CBC  HEMOGLOBIN A1C  HIV ANTIBODY (ROUTINE TESTING W REFLEX)     EKG  RADIOLOGY Chest x-ray: Clear CT abdomen/pelvis: Status post cystectomy with diverting ostomy, skin thickening and scarring along the midline lower abdominal wall with enterocutaneous fistula, enhancing fluid collection left inguinal region   I also independently reviewed and agree with radiologist interpretations.   PROCEDURES:  Critical Care performed: No  .1-3 Lead EKG Interpretation  Performed by: Shaune Pollack, MD Authorized by: Shaune Pollack, MD     Interpretation: normal     ECG rate:  100-130   ECG rate assessment: tachycardic     Rhythm: sinus tachycardia     Ectopy: none     Conduction: normal   Comments:     Indication: Abdominal pain     MEDICATIONS ORDERED IN ED: Medications  HYDROmorphone (DILAUDID) injection 0.5 mg (0.5 mg Intravenous Given 02/21/23 0135)  metoprolol succinate (TOPROL-XL) 24 hr tablet 100 mg (has no administration in time range)  spironolactone (ALDACTONE) tablet 25 mg (has no administration in time range)   oxyCODONE-acetaminophen (PERCOCET/ROXICET) 5-325 MG per tablet 2 tablet (has no administration in time range)  ondansetron (ZOFRAN-ODT) disintegrating tablet 4 mg (has no administration in time range)  nystatin-triamcinolone ointment (MYCOLOG) 1 Application (has no administration in time range)  insulin aspart (novoLOG) injection 0-15 Units (has no administration in time range)  heparin injection 5,000 Units (5,000 Units Subcutaneous Not Given 02/21/23 0042)  sodium chloride flush (NS) 0.9 % injection 3 mL (3 mLs Intravenous Given 02/21/23 0135)  0.9 %  sodium chloride infusion ( Intravenous New Bag/Given 02/21/23 0134)  acetaminophen (TYLENOL) tablet 650 mg (has no administration in time range)    Or  acetaminophen (TYLENOL) suppository 650 mg (has no administration in time range)  HYDROcodone-acetaminophen (NORCO/VICODIN) 5-325 MG per tablet 1 tablet (has no administration in time range)  ceFEPIme (MAXIPIME) 2 g in sodium chloride 0.9 % 100 mL IVPB (has no administration in time range)  vancomycin (VANCOREADY) IVPB 1500 mg/300 mL (has no administration in time range)  HYDROmorphone (DILAUDID) injection 1 mg (1 mg Intravenous Given 02/20/23 2158)  metroNIDAZOLE (FLAGYL) IVPB 500 mg (0 mg Intravenous Stopped 02/21/23 0000)  sodium chloride 0.9 % bolus 1,000 mL (0 mLs Intravenous Stopped 02/21/23 0026)  ceFEPIme (MAXIPIME) 2 g in sodium chloride 0.9 % 100 mL IVPB (0 g Intravenous Stopped 02/20/23 2240)  vancomycin (VANCOREADY) IVPB 2000 mg/400 mL (0 mg Intravenous Stopped 02/21/23 0126)  iohexol (OMNIPAQUE) 300 MG/ML solution 100 mL (100 mLs Intravenous Contrast Given 02/20/23 2253)  sodium chloride 0.9 % bolus 1,000 mL (0 mLs Intravenous Stopped 02/21/23 0027)  HYDROmorphone (DILAUDID) injection 1 mg (1 mg Intravenous Given 02/21/23 0017)     IMPRESSION / MDM / ASSESSMENT AND PLAN / ED COURSE  I reviewed the triage vital signs and the nursing notes.                              Differential diagnosis  includes, but is not limited to, abdominal wall cellulitis, abscess, worsening/new enterocutaneous fistula, intra-abdominal abscess  Patient's presentation is most consistent with acute presentation with potential threat to life or bodily function.  The patient is on the cardiac monitor to evaluate for evidence of arrhythmia and/or significant heart rate changes  52 yo F here with abdominal pain, increased drainage. Clinically, suspect acute on chronic abdominal wall cellulitis in setting of enterocutaneous fistula with significant drainage. Pt borderline febrile, tachycardic here. CBC largely at baseline. CMP unremarkable with exception of elevated BUN/Cr c/w mild dehydraiton. Pt has had  significant output from wounds. CT scan obtained, reviewed as above. Small area of abscess vs seroma in L inguinal area noted on CT - this area has a small amount of more purulent drainage on exam. Hesitant to I&D given her very poor wound healing and considering it is already draining - will place on empiric abx, admit for management and sx control.  FINAL CLINICAL IMPRESSION(S) / ED DIAGNOSES   Final diagnoses:  Abdominal pain, generalized  Abdominal wall cellulitis  Enterocutaneous fistula     Rx / DC Orders   ED Discharge Orders     None        Note:  This document was prepared using Dragon voice recognition software and may include unintentional dictation errors.   Shaune Pollack, MD 02/21/23 [phone]

## 2023-02-20 NOTE — ED Triage Notes (Signed)
Pt states she is a cancer patient and has fissures coming out of her abdomen but now has a wound. This nurse pulled the dressing back and there is a wound to her lower mid abd that has green drainage coming from it. PT states the pain is unbearable. PT visibly very uncomfortable.

## 2023-02-20 NOTE — Progress Notes (Signed)
PHARMACY -  BRIEF ANTIBIOTIC NOTE   Pharmacy has received consult(s) for Cefepime & Vancomycin from an ED provider.  The patient's profile has been reviewed for ht/wt/allergies/indication/available labs.    One time order(s) placed for Cefepime 2 gm & Vancomycin 2 gm per pt wt: 86.2 kg.  Further antibiotics/pharmacy consults should be ordered by admitting physician if indicated.                       Thank you, Otelia Sergeant, PharmD, [facility] 02/20/2023 9:43 PM

## 2023-02-21 DIAGNOSIS — I129 Hypertensive chronic kidney disease with stage 1 through stage 4 chronic kidney disease, or unspecified chronic kidney disease: Secondary | ICD-10-CM | POA: Diagnosis present

## 2023-02-21 DIAGNOSIS — S31109S Unspecified open wound of abdominal wall, unspecified quadrant without penetration into peritoneal cavity, sequela: Secondary | ICD-10-CM | POA: Diagnosis not present

## 2023-02-21 DIAGNOSIS — R1084 Generalized abdominal pain: Secondary | ICD-10-CM | POA: Diagnosis present

## 2023-02-21 DIAGNOSIS — T8143XA Infection following a procedure, organ and space surgical site, initial encounter: Secondary | ICD-10-CM | POA: Diagnosis present

## 2023-02-21 DIAGNOSIS — Z792 Long term (current) use of antibiotics: Secondary | ICD-10-CM | POA: Diagnosis not present

## 2023-02-21 DIAGNOSIS — F32A Depression, unspecified: Secondary | ICD-10-CM | POA: Diagnosis present

## 2023-02-21 DIAGNOSIS — D638 Anemia in other chronic diseases classified elsewhere: Secondary | ICD-10-CM | POA: Diagnosis present

## 2023-02-21 DIAGNOSIS — Y838 Other surgical procedures as the cause of abnormal reaction of the patient, or of later complication, without mention of misadventure at the time of the procedure: Secondary | ICD-10-CM | POA: Diagnosis present

## 2023-02-21 DIAGNOSIS — G4733 Obstructive sleep apnea (adult) (pediatric): Secondary | ICD-10-CM | POA: Diagnosis present

## 2023-02-21 DIAGNOSIS — L089 Local infection of the skin and subcutaneous tissue, unspecified: Secondary | ICD-10-CM

## 2023-02-21 DIAGNOSIS — Z791 Long term (current) use of non-steroidal anti-inflammatories (NSAID): Secondary | ICD-10-CM | POA: Diagnosis not present

## 2023-02-21 DIAGNOSIS — Z79899 Other long term (current) drug therapy: Secondary | ICD-10-CM | POA: Diagnosis not present

## 2023-02-21 DIAGNOSIS — L989 Disorder of the skin and subcutaneous tissue, unspecified: Secondary | ICD-10-CM | POA: Diagnosis present

## 2023-02-21 DIAGNOSIS — K632 Fistula of intestine: Secondary | ICD-10-CM | POA: Diagnosis present

## 2023-02-21 DIAGNOSIS — Z8541 Personal history of malignant neoplasm of cervix uteri: Secondary | ICD-10-CM | POA: Diagnosis not present

## 2023-02-21 DIAGNOSIS — N182 Chronic kidney disease, stage 2 (mild): Secondary | ICD-10-CM | POA: Diagnosis present

## 2023-02-21 DIAGNOSIS — L03311 Cellulitis of abdominal wall: Secondary | ICD-10-CM | POA: Diagnosis present

## 2023-02-21 DIAGNOSIS — Z936 Other artificial openings of urinary tract status: Secondary | ICD-10-CM | POA: Diagnosis not present

## 2023-02-21 DIAGNOSIS — Z888 Allergy status to other drugs, medicaments and biological substances status: Secondary | ICD-10-CM | POA: Diagnosis not present

## 2023-02-21 LAB — LACTIC ACID, PLASMA: Lactic Acid, Venous: 0.9 mmol/L (ref 0.5–1.9)

## 2023-02-21 LAB — HEMOGLOBIN A1C
Hgb A1c MFr Bld: 5.1 % (ref 4.8–5.6)
Mean Plasma Glucose: 99.67 mg/dL

## 2023-02-21 LAB — GLUCOSE, CAPILLARY
Glucose-Capillary: 67 mg/dL — ABNORMAL LOW (ref 70–99)
Glucose-Capillary: 64 mg/dL — ABNORMAL LOW (ref 70–99)
Glucose-Capillary: 77 mg/dL (ref 70–99)
Glucose-Capillary: 87 mg/dL (ref 70–99)
Glucose-Capillary: 86 mg/dL (ref 70–99)

## 2023-02-21 LAB — CULTURE, BLOOD (ROUTINE X 2): Culture: NO GROWTH

## 2023-02-21 LAB — HIV ANTIBODY (ROUTINE TESTING W REFLEX): HIV Screen 4th Generation wRfx: NONREACTIVE

## 2023-02-21 MED ORDER — HEPARIN SODIUM (PORCINE) 5000 UNIT/ML IJ SOLN
5000.0000 [IU] | Freq: Two times a day (BID) | INTRAMUSCULAR | Status: DC
  Administered 2023-02-21 – 2023-02-22 (×2): 5000 [IU] via SUBCUTANEOUS
  Filled 2023-02-21 (×2): qty 1

## 2023-02-21 MED ORDER — HYDROCODONE-ACETAMINOPHEN 5-325 MG PO TABS: 1.0000 | ORAL_TABLET | ORAL | Status: DC | PRN

## 2023-02-21 MED ORDER — ACETAMINOPHEN 325 MG PO TABS: 650.0000 mg | ORAL_TABLET | Freq: Four times a day (QID) | ORAL | Status: DC | PRN

## 2023-02-21 MED ORDER — OXYCODONE-ACETAMINOPHEN 5-325 MG PO TABS
2.0000 | ORAL_TABLET | Freq: Four times a day (QID) | ORAL | Status: DC | PRN
  Administered 2023-02-22: 2 via ORAL
  Filled 2023-02-21: qty 2

## 2023-02-21 MED ORDER — METOPROLOL SUCCINATE ER 100 MG PO TB24
100.0000 mg | ORAL_TABLET | Freq: Every day | ORAL | Status: DC
  Administered 2023-02-21: 100 mg via ORAL
  Filled 2023-02-21 (×2): qty 1

## 2023-02-21 MED ORDER — NYSTATIN-TRIAMCINOLONE 100000-0.1 UNIT/GM-% EX OINT
1.0000 | TOPICAL_OINTMENT | Freq: Two times a day (BID) | CUTANEOUS | Status: DC
  Administered 2023-02-21 – 2023-02-22 (×2): 1 via TOPICAL
  Filled 2023-02-21: qty 1
  Filled 2023-02-21: qty 15

## 2023-02-21 MED ORDER — DEXTROSE 10 % IV SOLN: INTRAVENOUS | Status: DC

## 2023-02-21 MED ORDER — SODIUM CHLORIDE 0.9% FLUSH
3.0000 mL | Freq: Two times a day (BID) | INTRAVENOUS | Status: DC
  Administered 2023-02-21 – 2023-02-22 (×4): 3 mL via INTRAVENOUS

## 2023-02-21 MED ORDER — SODIUM CHLORIDE 0.9 % IV SOLN
2.0000 g | Freq: Three times a day (TID) | INTRAVENOUS | Status: DC
  Administered 2023-02-21 – 2023-02-22 (×5): 2 g via INTRAVENOUS
  Filled 2023-02-21: qty 2
  Filled 2023-02-21 (×2): qty 12.5
  Filled 2023-02-21 (×2): qty 2
  Filled 2023-02-21: qty 12.5

## 2023-02-21 MED ORDER — HYDROCODONE-ACETAMINOPHEN 5-325 MG PO TABS: 2.0000 | ORAL_TABLET | Freq: Four times a day (QID) | ORAL | Status: DC | PRN

## 2023-02-21 MED ORDER — ACETAMINOPHEN 650 MG RE SUPP: 650.0000 mg | Freq: Four times a day (QID) | RECTAL | Status: DC | PRN

## 2023-02-21 MED ORDER — INSULIN ASPART 100 UNIT/ML IJ SOLN: 0.0000 [IU] | Freq: Three times a day (TID) | INTRAMUSCULAR | Status: DC

## 2023-02-21 MED ORDER — VANCOMYCIN HCL 1500 MG/300ML IV SOLN
1500.0000 mg | INTRAVENOUS | Status: DC
  Administered 2023-02-21: 1500 mg via INTRAVENOUS
  Filled 2023-02-21: qty 300

## 2023-02-21 MED ORDER — SPIRONOLACTONE 25 MG PO TABS
25.0000 mg | ORAL_TABLET | Freq: Every day | ORAL | Status: DC
  Administered 2023-02-21: 25 mg via ORAL
  Filled 2023-02-21 (×2): qty 1

## 2023-02-21 MED ORDER — CHLORHEXIDINE GLUCONATE CLOTH 2 % EX PADS
6.0000 | MEDICATED_PAD | Freq: Every day | CUTANEOUS | Status: DC
  Administered 2023-02-21 – 2023-02-22 (×2): 6 via TOPICAL

## 2023-02-21 MED ORDER — SODIUM CHLORIDE 0.9 % IV SOLN: INTRAVENOUS | Status: DC

## 2023-02-21 MED ORDER — ONDANSETRON 4 MG PO TBDP
4.0000 mg | ORAL_TABLET | Freq: Four times a day (QID) | ORAL | Status: DC | PRN
  Administered 2023-02-21: 4 mg via ORAL
  Filled 2023-02-21: qty 1

## 2023-02-21 MED ORDER — HYDROMORPHONE HCL 1 MG/ML IJ SOLN
0.5000 mg | INTRAMUSCULAR | Status: AC | PRN
  Administered 2023-02-21 (×2): 0.5 mg via INTRAVENOUS
  Filled 2023-02-21 (×2): qty 0.5

## 2023-02-21 MED ORDER — ONDANSETRON HCL 4 MG/2ML IJ SOLN
4.0000 mg | Freq: Four times a day (QID) | INTRAMUSCULAR | Status: DC | PRN
  Administered 2023-02-21 – 2023-02-22 (×2): 4 mg via INTRAVENOUS
  Filled 2023-02-21 (×2): qty 2

## 2023-02-21 MED ORDER — HYDROMORPHONE HCL 1 MG/ML IJ SOLN
1.0000 mg | Freq: Once | INTRAMUSCULAR | Status: AC
  Administered 2023-02-21: 1 mg via INTRAVENOUS
  Filled 2023-02-21: qty 1

## 2023-02-21 MED ORDER — DEXTROSE IN LACTATED RINGERS 5 % IV SOLN: INTRAVENOUS | Status: DC

## 2023-02-21 NOTE — Assessment & Plan Note (Signed)
Continue CPAP per home settings.

## 2023-02-21 NOTE — Hospital Course (Addendum)
Cassandra Russo is a 52 y.o. 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  The patient states that over the last several days, she has had progressively worsening aching, throbbing, severe pain along her midline abdominal scar.  She has had increased drainage.   05/03: in ED, BP 185/136 and HR 113 in triage. On exam multiple open, draining wounds along the midline abdomen, draining feculent material. purulent material along the inferior most aspect of the wound as well. Diffuse, severe tenderness to palpation with some slight skin induration. Right diverting urostomy in place. CBC/CMP largely unremarkable. UA question contamination. BCx collected. CXR non-acute. CT abdomen/pelvis: s/p cystectomy with diverting ostomy, skin thickening and scarring midline lower abdominal wall with enterocutaneous fistula, small abscess vs seroma in L inguinal area - no I&D done since already draining. Tx: pain control, cefepime, vancomycin, metronidazole, wound care. Admitted to hospitalist service for IV abx and pain control.  05/04: pending BCx. Restarting TPN - can't get TPN until tomorrow, placed on D10. Await BCx. D/w general surgery Dr Toula Moos - defer official consult but he has reviewed imaging/photos, suspect L groin seroma and this can be addressed by IR if needed, anterior abdominal wall does not appear to require any surgical intervention.  05/05: Hgb trend down some to 7.9, renal fxn stable, BCx NGx2d ***  Consultants:  General surgery   Procedures: none    ASSESSMENT & PLAN:   Principal Problem:   Chronic abdominal wound infection, sequela Active Problems:   Anemia   Enterocutaneous fistula   Chronic kidney disease, stage 2 (mild)   Depression, unspecified   Essential hypertension   Obstructive sleep apnea (adult) (pediatric)   Chronic abdominal wound infection,  sequela Enterocutaneous fistula - sequelae of previous tx for hx cervical cancer  NO sepsis  follows w/ [facility]  No surgical intervention needed at this time  Vancomycin + Cefepime pending cultures --> BCx negative, ***  Wound care  Pain control  Await BCx results   Anemia likely d/t chronic inflammation Monitor CBC No bleeding, monitor closely    Chronic kidney disease, stage 2 (mild) Stable  Monitor BMP  Depression, unspecified Follow outpatient   Essential hypertension Continue home metoprolol Adjust as needed  Obstructive sleep apnea (adult)  CPAP qhs   DVT prophylaxis: heparin  Pertinent IV fluids/nutrition: D10, CLD, possible restart TPN tomorrowif not discharging  Central lines / invasive devices: PICC, bladder ostomy   Code Status: FULL CODE ACP documentation reviewed: 05/04, none on file   Current Admission Status: inpatient   TOC needs / Dispo plan: plan resume HH, may need rehab pending PT/OT eval  Barriers to discharge / significant pending items: culture result, general surgery eval/opinion

## 2023-02-21 NOTE — Progress Notes (Addendum)
Initial Nutrition Assessment  DOCUMENTATION CODES:   Obesity unspecified  INTERVENTION:   -TPN management per pharmacy; to start 02/22/23 -Clear liquids per home regimen  NUTRITION DIAGNOSIS:   Increased nutrient needs related to chronic illness (EC fistula) as evidenced by estimated needs.  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   Reviewed I/O's: -700 ml x 24 hours and -798 ml since admission  UOP: 700 ml x 24 hours   Pt unavailable at time of visit. Attempted to speak with pt via call to hospital room phone, however, unable to reach. RD unable to obtain further nutrition-related history or complete nutrition-focused physical exam at this time.    General surgery consult pending.   Per RN notes, pt on clear liquids at home.   Reviewed wt hx; pt has experienced a 11.2% wt loss over the past 6 months, which is significant for time frame.   Pt receives TPN from [facility]. She receives sole source nutrition via TPN since 11/25/22 secondary to [facility] fistula and anastomotic breakdown. 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  Per [facility], pt complains of hunger (no difference between days with lipids vs no lipids). Home TPN regimen is as follows:  Total Infusion Days 7 days per week Number of Clear Days 3 days per week (see below for schedule) Number of Lipid Days 4 days per week  INFUSION SCHEDULE: Sunday Lipid Monday  Clear Tuesday Lipid Wednesday Clear Thursday Lipid Friday Clear Saturday Lipid  Formula Volume 3000 mL Infusion Time 16 hours (to include a 2 hour taper-down)  MACRONUTRIENTS Protein 1033.33 mL 15% 155 Gm 15% Clinisol  Dextrose 571.43 mL 70% 400 Gm  Lipid 300 mL 20% 60 Gm 4 days per week SMOF   CALORIES: kcal per Clear Bag 1980 Kcal kcal per Lipid Bag 2580 Kcals Average kcal per day 2322.86 Kcal  ELECTROLYTES (Amount per day) Sodium Chloride (mEq): 255 mEq Sodium Phosphate (mEq): 10 mEq Sodium Acetate (mEq): 50 mEq  Potassium Acetate (mEq): 90 mEq  Calcium Gluconate Lipid (mEq): 12 mEq Calcium Gluconate Clear (mEq): 12 mEq Magnesium Sulfate Lipid (mEq): 32 mEq Magnesium Sulfate Clear (mEq): 32 mEq  Total Sodium 315 mEq  Total Potassium 90 mEq Total Phosphorus 7.5 mMol  Total Chloride 255 mEq Total Acetate 140 mEq  ADDITIVES (Amount per day)  To be added by Pharmacy/Trace Elements:  Trace Elements (mL): 1 mL Tralement MTE-4 (1 mL)  To be added by Patient: Multivitamin (mL): 10 mL Famotidine (mg): 40 mg  ADDITIONAL ORDERS (IV Hydration, Ethanol Locks, Special Instructions, Etc.) Please use dual-chamber bags with lipid infusions if available and appropriate for infusion volume. Dispense flush syringe quantities with refills as needed to maintain venous access as necessary to complete therapy and thereafter until IV access is discontinued. Dispense catheter dressing and associated supplies for therapy administration. Change dressing weekly and as needed. Please pre-spike all bags.  IV Access: DL PICC (placed 10/25/08)  LOCK THERAPY  Electrolyte Supplement  For potassium  supplements: Infuse at a rate of 10 mEq/hour via available device. For magnesium supplements: Infuse at a rate of no more than 1 gram per hour.  PRN Hydration Fluids Infusion Volume (mL): 500 mL Base Fluid: 0.9% Sodium Chloride (NS) Hydration Infusion Time (Hours): 2 hours Refills: prn x 1  year   Medications reviewed and include aldactone and dextrose 10% infusion @ 75 ml/hr.   Labs reviewed: CBGS: 77-87 (inpatient orders for glycemic control are 0-15 units insulin aspart TID with meals).   Diet Order:   Diet Order             Diet heart healthy/carb modified Room service appropriate? Yes; Fluid consistency: Thin  Diet effective now                   EDUCATION NEEDS:   No education needs have been identified at this time  Skin:  Skin Assessment: Skin Integrity Issues: Skin Integrity Issues:: Other (Comment) Other: EC fistula to abdomen  Last BM:     Height:   Ht Readings from Last 1 Encounters:  02/20/23 5\' 5"  (1.651 m)    Weight:   Wt Readings from Last 1 Encounters:  02/21/23 86.2 kg    Ideal Body Weight:  56.8 kg  BMI:  Body mass index is 31.62 kg/m.  Estimated Nutritional Needs:   Kcal:  1478-2956  Protein:  130-145 grams  Fluid:  > 2 L    Levada Schilling, RD, LDN, CDCES Registered Dietitian II Certified Diabetes Care and Education Specialist Please refer to [facility] for RD and/or RD on-call/weekend/after hours pager

## 2023-02-21 NOTE — Progress Notes (Signed)
PROGRESS NOTE    Gaylee Doorley   ZOX:[id number] DOB: 15-Apr-1971  DOA: 02/20/2023 Date of Service: 02/21/23 PCP: Patient, No Pcp Per     Brief Narrative / Hospital Course:  Zuri Saccomanno is a 52 y.o. 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  The patient states that over the last several days, she has had progressively worsening aching, throbbing, severe pain along her midline abdominal scar.  She has had increased drainage.   05/03: in ED, BP 185/136 and HR 113 in triage. On exam multiple open, draining wounds along the midline abdomen, draining feculent material. purulent material along the inferior most aspect of the wound as well. Diffuse, severe tenderness to palpation with some slight skin induration. Right diverting urostomy in place. CBC/CMP largely unremarkable. UA question contamination. BCx collected. CXR non-acute. CT abdomen/pelvis: s/p cystectomy with diverting ostomy, skin thickening and scarring midline lower abdominal wall with enterocutaneous fistula, small abscess vs seroma in L inguinal area - no I&D done since already draining. Tx: pain control, cefepime, vancomycin, metronidazole, wound care. Admitted to hospitalist service for IV abx and pain control.  05/04: pending BCx. Restarting TPN - can't get TPN until tomorrow, placed on D10. Await BCx.   Consultants:  General surgery   Procedures: none      ASSESSMENT & PLAN:   Principal Problem:   Chronic abdominal wound infection, sequela Active Problems:   Anemia   Enterocutaneous fistula   Chronic kidney disease, stage 2 (mild)   Depression, unspecified   Essential hypertension   Obstructive sleep apnea (adult) (pediatric)   Chronic abdominal wound infection, sequela Enterocutaneous fistula - sequelae of previous tx for hx cervical cancer  NO sepsis  follows w/ [facility]  Will ask General  Surgery here to give an opinion on intervention here vs follow w/ [facility]  Vancomycin + Cefepime pending cultures  Wound care  Pain control  Await BCx results   Anemia likely d/t chronic inflammation Monitor CBC No bleeding, monitor closely    Chronic kidney disease, stage 2 (mild) Monitor BMP  Depression, unspecified Follow outpatient   Essential hypertension Continue home metoprolol Adjust as needed  Obstructive sleep apnea (adult)  CPAP qhs   DVT prophylaxis: heparin  Pertinent IV fluids/nutrition: D10, CLD, possible restart TPN tomorrowif not discharging  Central lines / invasive devices: PICC, bladder ostomy   Code Status: FULL CODE ACP documentation reviewed: 05/04, none on file   Current Admission Status: inpatient   TOC needs / Dispo plan: plan resume HH, may need rehab pending PT/OT eval  Barriers to discharge / significant pending items: culture result, general surgery eval/opinion              Subjective / Brief ROS:  Patient reports pain is pretty bad today Denies CP/SOB.   Denies new weakness.  Tolerating clears Reports no concerns w/ urination/defecation.   Family Communication: none at this time, will call daughter later today     Objective Findings:  Vitals:   02/21/23 0015 02/21/23 0356 02/21/23 0446 02/21/23 0811  BP: (!) 160/94 134/75  134/81  Pulse:  (!) 106  (!) 107  Resp:  20  16  Temp:  98.4 F (36.9 C)  97.7 F (36.5 C)  TempSrc:  Oral  Oral  SpO2:  99%  97%  Weight:   86.2 kg   Height:        Intake/Output  Summary (Last 24 hours) at 02/21/2023 1306 Last data filed at 02/21/2023 6433 Gross per 24 hour  Intake 202.43 ml  Output 300 ml  Net -97.57 ml   Filed Weights   02/20/23 1806 02/21/23 0446  Weight: 86.2 kg 86.2 kg    Examination:  Physical Exam Constitutional:      General: She is not in acute distress. Cardiovascular:     Rate and Rhythm: Normal rate and regular rhythm.  Pulmonary:     Effort: Pulmonary  effort is normal.     Breath sounds: Normal breath sounds.  Abdominal:     Palpations: Abdomen is soft.  Musculoskeletal:     Right lower leg: No edema.     Left lower leg: No edema.  Skin:    Comments: See photo below   Neurological:     General: No focal deficit present.     Mental Status: She is alert and oriented to person, place, and time.  Psychiatric:        Mood and Affect: Mood normal.        Behavior: Behavior normal.           Scheduled Medications:   Chlorhexidine Gluconate Cloth  6 each Topical Daily   heparin  5,000 Units Subcutaneous Q12H   insulin aspart  0-15 Units Subcutaneous TID WC   metoprolol succinate  100 mg Oral Daily   nystatin-triamcinolone ointment  1 Application Topical BID   sodium chloride flush  3 mL Intravenous Q12H   spironolactone  25 mg Oral Daily    Continuous Infusions:  ceFEPime (MAXIPIME) IV Stopped (02/21/23 0601)   dextrose     vancomycin      PRN Medications:  acetaminophen **OR** acetaminophen, HYDROcodone-acetaminophen, HYDROmorphone (DILAUDID) injection, ondansetron, oxyCODONE-acetaminophen  Antimicrobials from admission:  Anti-infectives (From admission, onward)    Start     Dose/Rate Route Frequency Ordered Stop   02/21/23 2200  vancomycin (VANCOREADY) IVPB 1500 mg/300 mL        1,500 mg 150 mL/hr over 120 Minutes Intravenous Every 24 hours 02/21/23 0037     02/21/23 0600  ceFEPIme (MAXIPIME) 2 g in sodium chloride 0.9 % 100 mL IVPB        2 g 200 mL/hr over 30 Minutes Intravenous Every 8 hours 02/21/23 0037     02/20/23 2145  metroNIDAZOLE (FLAGYL) IVPB 500 mg        500 mg 100 mL/hr over 60 Minutes Intravenous  Once 02/20/23 2140 02/21/23 0000   02/20/23 2145  ceFEPIme (MAXIPIME) 2 g in sodium chloride 0.9 % 100 mL IVPB        2 g 200 mL/hr over 30 Minutes Intravenous  Once 02/20/23 2143 02/20/23 2240   02/20/23 2145  vancomycin (VANCOREADY) IVPB 2000 mg/400 mL        2,000 mg 200 mL/hr over 120 Minutes  Intravenous  Once 02/20/23 2143 02/21/23 0126           Data Reviewed:  I have personally reviewed the following...  CBC: Recent Labs  Lab 02/20/23 1810  WBC 6.4  NEUTROABS 3.7  HGB 9.1*  HCT 28.7*  MCV 91.7  PLT 444*   Basic Metabolic Panel: Recent Labs  Lab 02/20/23 1810  NA 137  K 4.2  CL 102  CO2 24  GLUCOSE 93  BUN 41*  CREATININE 1.01*  CALCIUM 9.1   GFR: Estimated Creatinine Clearance: 70.7 mL/min (A) (by C-G formula based on SCr of 1.01 mg/dL (H)). Liver Function  Tests: Recent Labs  Lab 02/20/23 1810  AST 31  ALT 33  ALKPHOS 220*  BILITOT 0.7  PROT 7.8  ALBUMIN 2.8*   No results for input(s): "LIPASE", "AMYLASE" in the last 168 hours. No results for input(s): "AMMONIA" in the last 168 hours. Coagulation Profile: Recent Labs  Lab 02/20/23 1810  INR 1.1   Cardiac Enzymes: No results for input(s): "CKTOTAL", "CKMB", "CKMBINDEX", "TROPONINI" in the last 168 hours. BNP (last 3 results) No results for input(s): "PROBNP" in the last 8760 hours. HbA1C: Recent Labs    02/21/23 0416  HGBA1C 5.1   CBG: Recent Labs  Lab 02/21/23 1127 02/21/23 1204  GLUCAP 67* 64*   Lipid Profile: No results for input(s): "CHOL", "HDL", "LDLCALC", "TRIG", "CHOLHDL", "LDLDIRECT" in the last 72 hours. Thyroid Function Tests: No results for input(s): "TSH", "T4TOTAL", "FREET4", "T3FREE", "THYROIDAB" in the last 72 hours. Anemia Panel: No results for input(s): "VITAMINB12", "FOLATE", "FERRITIN", "TIBC", "IRON", "RETICCTPCT" in the last 72 hours. Most Recent Urinalysis On File:     Component Value Date/Time   COLORURINE YELLOW (A) 02/20/2023 1824   APPEARANCEUR HAZY (A) 02/20/2023 1824   LABSPEC 1.014 02/20/2023 1824   PHURINE 6.0 02/20/2023 1824   GLUCOSEU NEGATIVE 02/20/2023 1824   HGBUR MODERATE (A) 02/20/2023 1824   BILIRUBINUR NEGATIVE 02/20/2023 1824   KETONESUR NEGATIVE 02/20/2023 1824   PROTEINUR 30 (A) 02/20/2023 1824   NITRITE NEGATIVE  02/20/2023 1824   LEUKOCYTESUR NEGATIVE 02/20/2023 1824   Sepsis Labs: @LABRCNTIP (procalcitonin:4,lacticidven:4) Microbiology: Recent Results (from the past 240 hour(s))  Culture, blood (Routine x 2)     Status: None (Preliminary result)   Collection Time: 02/20/23  6:22 PM   Specimen: BLOOD RIGHT HAND  Result Value Ref Range Status   Specimen Description BLOOD RIGHT HAND  Final   Special Requests   Final    BOTTLES DRAWN AEROBIC AND ANAEROBIC Blood Culture adequate volume   Culture   Final    NO GROWTH < 12 HOURS Performed at [facility], [address], [place], [place] [address]    Report Status PENDING  Incomplete  Culture, blood (Routine x 2)     Status: None (Preliminary result)   Collection Time: 02/20/23  6:25 PM   Specimen: BLOOD  Result Value Ref Range Status   Specimen Description BLOOD LEFT ANTECUBITAL  Final   Special Requests   Final    BOTTLES DRAWN AEROBIC AND ANAEROBIC Blood Culture adequate volume   Culture   Final    NO GROWTH < 12 HOURS Performed at [facility], [address] [place], [place] [address]    Report Status PENDING  Incomplete      Radiology Studies last 3 days: CT ABDOMEN PELVIS W CONTRAST  Result Date: 02/20/2023 CLINICAL DATA:  Acute abdominal pain. History of cancer with fissures. EXAM: CT ABDOMEN AND PELVIS WITH CONTRAST TECHNIQUE: Multidetector CT imaging of the abdomen and pelvis was performed using the standard protocol following bolus administration of intravenous contrast. RADIATION DOSE REDUCTION: This exam was performed according to the departmental dose-optimization program which includes automated exposure control, adjustment of the mA and/or kV according to patient size and/or use of iterative reconstruction technique. CONTRAST:  OMNIPAQUE IOHEXOL 300 MG/ML  SOLN COMPARISON:  None Available. FINDINGS: Lower chest: There is atelectasis in the lung bases Hepatobiliary: Small stones or gallbladder sludge  present. No biliary ductal dilatation. The liver is within normal limits. Pancreas: Unremarkable. No pancreatic ductal dilatation or surrounding inflammatory changes. Spleen: Normal  in size without focal abnormality. Adrenals/Urinary Tract: Patient is status post cystectomy with right lower quadrant diverting ostomy. There is moderate bilateral hydronephrosis, left greater than right without obstructing calculus identified. There is no perinephric stranding or fluid. The adrenal glands are within normal limits. Stomach/Bowel: There is no bowel obstruction or free air. Contrast is seen throughout the colon to the level the rectum. There is no free air. Multiple small bowel loops approximate the lower anterior abdominal wall. The appendix is not seen. Stomach is within normal limits. Vascular/Lymphatic: No significant vascular findings are present. No enlarged abdominal or pelvic lymph nodes. Reproductive: Status post hysterectomy. No adnexal masses. Other: There is skin thickening and scarring in the midline lower abdominal wall. There is ill-defined fluid and a few small foci of air within this region image 2/69. There is a rounded enhancing fluid collection in the left inguinal region measuring 2.5 by 2.5 by 3.3 cm. There is no ascites. Presacral edema is present. Musculoskeletal: Degenerative changes affect the spine IMPRESSION: 1. Patient is status post cystectomy with right lower quadrant diverting ostomy. There is moderate bilateral hydronephrosis, left greater than right. No obstructing calculus identified. 2. Skin thickening and scarring in the midline lower abdominal wall with ill-defined fluid and a few small foci of air. Findings may be related to postoperative fluid collection, air or possibly fistula given underlying small-bowel loops approximating the anterior abdominal wall. 3. Enhancing fluid collection in the left inguinal region measuring up to 3.3 cm. This may represent postoperative seroma or  abscess. 4. Presacral edema. 5. No bowel obstruction or free air. Electronically Signed   By: Darliss Cheney M.D.   On: 02/20/2023 23:10   DG Chest 2 View  Result Date: 02/20/2023 CLINICAL DATA:  Lower mid abdominal wound with drainage, suspected sepsis EXAM: CHEST - 2 VIEW COMPARISON:  None Available. FINDINGS: Frontal and lateral views of the chest demonstrate an enlarged cardiac silhouette. No airspace disease, effusion, or pneumothorax. Right-sided PICC tip in the region of the right atrium. No acute bony abnormalities. IMPRESSION: 1. No acute intrathoracic process. 2. Enlarged cardiac silhouette. Electronically Signed   By: Sharlet Salina M.D.   On: 02/20/2023 18:41             LOS: 0 days      Sunnie Nielsen, DO [facility] 02/21/2023, 1:06 PM    Dictation software may have been used to generate the above note. Typos may occur and escape review in typed/dictated notes. Please contact Dr Lyn Hollingshead directly for clarity if needed.  Staff may message me via secure chat in Epic  but this may not receive an immediate response,  please page me for urgent matters!  If 7PM-7AM, please contact night coverage [email or web address]

## 2023-02-21 NOTE — Progress Notes (Signed)
Patients daughter stated that patient is on clear liquids and TPN at home.

## 2023-02-21 NOTE — Assessment & Plan Note (Signed)
Patient has a history of enterocutaneous fistula status post drain. Will continue with IV antibiotics and general surgery consulted AMS and sepsis.

## 2023-02-21 NOTE — Progress Notes (Signed)
Pharmacy Antibiotic Note  Donyelle Hoen is a 52 y.o. female admitted on 02/20/2023 with wound infection.  Pharmacy has been consulted for Cefepime & Vancomycin dosing.  Plan: Cefepime 2 gm q8hr per indication & renal fxn.  Pt given Vancomycin 2000 mg once. Vancomycin 1500 mg IV Q 24 hrs. Goal AUC 400-550. Expected AUC: 455.5 SCr used: 1.01  Pharmacy will continue to follow and will adjust abx dosing whenever warranted.  Temp (24hrs), Avg:99.3 F (37.4 C), Min:99.3 F (37.4 C), Max:99.3 F (37.4 C)   Recent Labs  Lab 02/20/23 1810 02/20/23 1822  WBC 6.4  --   CREATININE 1.01*  --   LATICACIDVEN  --  1.1    Estimated Creatinine Clearance: 70.7 mL/min (A) (by C-G formula based on SCr of 1.01 mg/dL (H)).    No Known Allergies  Antimicrobials this admission: 5/03 Cefepime >> 5/03 Vancomycin >>   Microbiology results: 5/03 BCx: Pending  Thank you for allowing pharmacy to be a part of this patient's care.  Otelia Sergeant, PharmD, [facility] 02/21/2023 12:37 AM

## 2023-02-21 NOTE — Assessment & Plan Note (Signed)
    Latest Ref Rng & Units 02/20/2023    6:10 PM 09/01/2022    1:05 AM 11/04/2021    9:31 PM  CBC  WBC 4.0 - 10.5 K/uL 6.4  5.7  6.4   Hemoglobin 12.0 - 15.0 g/dL 9.1  78.2  95.6   Hematocrit 36.0 - 46.0 % 28.7  35.3  34.8   Platelets 150 - 400 K/uL 444  265  241   Hemoglobin is 9.1 today lower then his November Blood work however we will follow Type/scree/ iv ppi.  Avoid NSIADS.

## 2023-02-21 NOTE — Assessment & Plan Note (Signed)
Lab Results  Component Value Date   CREATININE 1.01 (H) 02/20/2023   CREATININE 0.90 09/01/2022   CREATININE 0.88 11/04/2021  Monitor / renally dose/ avoid contrast. And follow.

## 2023-02-21 NOTE — Assessment & Plan Note (Addendum)
Vitals:   02/20/23 1805 02/20/23 1815 02/20/23 2115 02/20/23 2130  BP: (!) 185/136 (!) 159/79 (!) 175/98 (!) 165/92   02/20/23 2145 02/20/23 2200 02/20/23 2215 02/20/23 2230  BP: (!) 165/93 (!) 171/93 (!) 187/92 (!) 167/78   02/20/23 2245  BP: (!) 155/85  We will continue patient on metoprolol Aldactone.

## 2023-02-21 NOTE — Assessment & Plan Note (Signed)
(  We will continue once med reconciliation is available.

## 2023-02-21 NOTE — ED Notes (Signed)
Pt has been cleaned up, wounds dressed with abd/peri-pads (to try to stop the stool) and barrier cream has been placed in pt's folds.  She appears more comfortable now (and was given pain medications per order). Daughter remains at bedside

## 2023-02-21 NOTE — H&P (Signed)
History and Physical     Patient: Cassandra Russo ZOX:[id number] DOB: 1971-09-17 DOA: 02/20/2023 DOS: the patient was seen and examined on 02/21/2023 PCP: Patient, No Pcp Per   Patient coming from: Home  Chief Complaint: Abdominal wound/ cellulitis.   HISTORY OF PRESENT ILLNESS: Cassandra Russo is an 52 y.o. 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  Patient is currently followed by [facility] and they have tried a colostomy bag patient also has some issues with skin breakdown in her groin area.  Past Medical History:  Diagnosis Date   Cervical cancer (HCC)    Hypertension    Review of Systems  Neurological:  Positive for weakness.   Allergies  Allergen Reactions   Irbesartan Swelling and Other (See Comments)   Amlodipine Swelling   History reviewed. No pertinent surgical history. MEDICATIONS: Prior to Admission medications   Medication Sig Start Date End Date Taking? Authorizing Provider  HYDROcodone-acetaminophen (NORCO/VICODIN) 5-325 MG tablet Take 2 tablets by mouth every 6 (six) hours as needed for moderate pain or severe pain. 09/01/22   Loleta Rose, MD  metoprolol succinate (TOPROL-XL) 100 MG 24 hr tablet Take 100 mg by mouth daily. 12/20/21   [provider]  naproxen (NAPROSYN) 500 MG tablet Take 1 tablet (500 mg total) by mouth 2 (two) times daily with a meal. 02/05/22   Sharman Cheek, MD  nystatin-triamcinolone ointment [facility]) Apply 1 Application topically 2 (two) times daily. 02/01/23   Ward, Layla Maw, DO  ondansetron (ZOFRAN-ODT) 4 MG disintegrating tablet Take 1 tablet (4 mg total) by mouth every 6 (six) hours as needed for nausea or vomiting. 02/01/23   Ward, Layla Maw, DO  oxyCODONE-acetaminophen (PERCOCET) 5-325 MG tablet Take 2 tablets by mouth every 6 (six) hours as needed for severe pain. 02/01/23 02/01/24  Ward, Layla Maw, DO  spironolactone (ALDACTONE) 25 MG tablet Take 25 mg by mouth  daily. 12/20/21   [provider]  sulfamethoxazole-trimethoprim (BACTRIM DS) 800-160 MG tablet Take 1 tablet by mouth 2 (two) times daily. 02/01/23   Ward, Layla Maw, DO    heparin  5,000 Units Subcutaneous Q12H   insulin aspart  0-15 Units Subcutaneous TID WC   metoprolol succinate  100 mg Oral Daily   nystatin-triamcinolone ointment  1 Application Topical BID   sodium chloride flush  3 mL Intravenous Q12H   spironolactone  25 mg Oral Daily    sodium chloride     ceFEPime (MAXIPIME) IV     vancomycin     ED Course: Pt in Ed is ill appearing. Vitals:   02/20/23 2215 02/20/23 2230 02/20/23 2245 02/21/23 0015  BP: (!) 187/92 (!) 167/78 (!) 155/85 (!) 160/94  Pulse: (!) 105 (!) 107 (!) 111   Resp:      Temp:      TempSrc:      SpO2: 98% 96% 97%   Weight:      Height:       No intake/output data recorded. SpO2: 97 % Blood work in ed shows: Creatinine of 1.01, alk phos of 220, albumin of 2.8. Results for orders placed or performed during the hospital encounter of 02/20/23 (from the past 72 hour(s))  Comprehensive metabolic panel     Status: Abnormal   Collection Time: 02/20/23  6:10 PM  Result Value Ref Range   Sodium 137 135 - 145 mmol/L   Potassium 4.2 3.5 - 5.1 mmol/L   Chloride 102 98 - 111  mmol/L   CO2 24 22 - 32 mmol/L   Glucose, Bld 93 70 - 99 mg/dL    Comment: Glucose reference range applies only to samples taken after fasting for at least 8 hours.   BUN 41 (H) 6 - 20 mg/dL   Creatinine, Ser 4.54 (H) 0.44 - 1.00 mg/dL   Calcium 9.1 8.9 - 09.8 mg/dL   Total Protein 7.8 6.5 - 8.1 g/dL   Albumin 2.8 (L) 3.5 - 5.0 g/dL   AST 31 15 - 41 U/L   ALT 33 0 - 44 U/L   Alkaline Phosphatase 220 (H) 38 - 126 U/L   Total Bilirubin 0.7 0.3 - 1.2 mg/dL   GFR, Estimated >11 >91 mL/min    Comment: (NOTE) Calculated using the CKD-EPI Creatinine Equation (2021)    Anion gap 11 5 - 15    Comment: Performed at [facility], [address] [place], [place] [address]   CBC with Differential     Status: Abnormal   Collection Time: 02/20/23  6:10 PM  Result Value Ref Range   WBC 6.4 4.0 - 10.5 K/uL   RBC 3.13 (L) 3.87 - 5.11 MIL/uL   Hemoglobin 9.1 (L) 12.0 - 15.0 g/dL   HCT 56.2 (L) 13.0 - 86.5 %   MCV 91.7 80.0 - 100.0 fL   MCH 29.1 26.0 - 34.0 pg   MCHC 31.7 30.0 - 36.0 g/dL   RDW 78.4 69.6 - 29.5 %   Platelets 444 (H) 150 - 400 K/uL   nRBC 0.0 0.0 - 0.2 %   Neutrophils Relative % 58 %   Neutro Abs 3.7 1.7 - 7.7 K/uL   Lymphocytes Relative 34 %   Lymphs Abs 2.2 0.7 - 4.0 K/uL   Monocytes Relative 5 %   Monocytes Absolute 0.3 0.1 - 1.0 K/uL   Eosinophils Relative 3 %   Eosinophils Absolute 0.2 0.0 - 0.5 K/uL   Basophils Relative 0 %   Basophils Absolute 0.0 0.0 - 0.1 K/uL   Immature Granulocytes 0 %   Abs Immature Granulocytes 0.02 0.00 - 0.07 K/uL    Comment: Performed at [facility], [address] [place], [place] [address]  Protime-INR     Status: None   Collection Time: 02/20/23  6:10 PM  Result Value Ref Range   Prothrombin Time 14.0 11.4 - 15.2 seconds   INR 1.1 0.8 - 1.2    Comment: (NOTE) INR goal varies based on device and disease states. Performed at [facility], [address] [place], [place] [address]   Lactic acid, plasma     Status: None   Collection Time: 02/20/23  6:22 PM  Result Value Ref Range   Lactic Acid, Venous 1.1 0.5 - 1.9 mmol/L    Comment: Performed at [facility], [address] [place], [place] [address]  Urinalysis, Routine w reflex microscopic -Urine, Unspecified Source     Status: Abnormal   Collection Time: 02/20/23  6:24 PM  Result Value Ref Range   Color, Urine YELLOW (A) YELLOW   APPearance HAZY (A) CLEAR   Specific Gravity, Urine 1.014 1.005 - 1.030   pH 6.0 5.0 - 8.0   Glucose, UA NEGATIVE NEGATIVE mg/dL   Hgb urine dipstick MODERATE (A) NEGATIVE   Bilirubin Urine NEGATIVE NEGATIVE   Ketones, ur NEGATIVE NEGATIVE mg/dL   Protein, ur 30 (A) NEGATIVE mg/dL    Nitrite NEGATIVE NEGATIVE   Leukocytes,Ua NEGATIVE NEGATIVE   RBC / HPF >50  0 - 5 RBC/hpf   WBC, UA 21-50 0 - 5 WBC/hpf   Bacteria, UA MANY (A) NONE SEEN   Squamous Epithelial / HPF NONE SEEN 0 - 5 /HPF   Mucus PRESENT     Comment: Performed at [facility], [address] [place], [place] [address]    Lab Results  Component Value Date   CREATININE 1.01 (H) 02/20/2023   CREATININE 0.90 09/01/2022   CREATININE 0.88 11/04/2021      Latest Ref Rng & Units 02/20/2023    6:10 PM 09/01/2022    1:05 AM 11/04/2021    9:31 PM  CMP  Glucose 70 - 99 mg/dL 93  99  92   BUN 6 - 20 mg/dL 41  19  17   Creatinine 0.44 - 1.00 mg/dL 6.04  5.40  9.81   Sodium 135 - 145 mmol/L 137  142  139   Potassium 3.5 - 5.1 mmol/L 4.2  4.1  3.5   Chloride 98 - 111 mmol/L 102  110  106   CO2 22 - 32 mmol/L 24  25  27    Calcium 8.9 - 10.3 mg/dL 9.1  9.3  8.8   Total Protein 6.5 - 8.1 g/dL 7.8   7.3   Total Bilirubin 0.3 - 1.2 mg/dL 0.7   0.7   Alkaline Phos 38 - 126 U/L 220   89   AST 15 - 41 U/L 31   15   ALT 0 - 44 U/L 33   9    Unresulted Labs (From admission, onward)     Start     Ordered   02/21/23 0800  HIV Antibody (routine testing w rflx)  (HIV Antibody (Routine testing w reflex) panel)  Once,   URGENT        02/21/23 0047   02/21/23 0500  Comprehensive metabolic panel  Tomorrow morning,   STAT        02/21/23 0027   02/21/23 0500  CBC  Tomorrow morning,   STAT        02/21/23 0027   02/21/23 0500  Hemoglobin A1c  Once,   URGENT       Comments: To assess prior glycemic control    02/21/23 0047   02/20/23 2034  Lactic acid, plasma  Once,   STAT        02/20/23 2034   02/20/23 1808  Culture, blood (Routine x 2)  BLOOD CULTURE X 2,   STAT      02/20/23 1807           Pt has received : Orders Placed This Encounter  Procedures   1-3 Lead EKG Interpretation    This order was created via procedure documentation    Standing Status:   Standing    Number of Occurrences:   1    Culture, blood (Routine x 2)    Standing Status:   Standing    Number of Occurrences:   2   DG Chest 2 View    Standing Status:   Standing    Number of Occurrences:   1    Order Specific Question:   Reason for Exam (SYMPTOM  OR DIAGNOSIS REQUIRED)    Answer:   Suspected Sepsis   CT ABDOMEN PELVIS W CONTRAST    Standing Status:   Standing    Number of Occurrences:   1    Order Specific Question:   Bypass Screening Labs?    Answer:   I,  as the ordering provider, have weighed the risks vs. benefits for this patient and concluded this exam meets the requirements for medical necessity to bypass lab work needed prior to CT.    Order Specific Question:   Does the patient have a contrast media/X-ray dye allergy?    Answer:   No    Order Specific Question:   If indicated for the ordered procedure, I authorize the administration of contrast media per Radiology protocol    Answer:   Yes    Order Specific Question:   If indicated for the ordered procedure, I authorize the administration of oral contrast media per Radiology protocol    Answer:   Yes   Comprehensive metabolic panel    Standing Status:   Standing    Number of Occurrences:   1   Lactic acid, plasma    Standing Status:   Standing    Number of Occurrences:   2   CBC with Differential    Standing Status:   Standing    Number of Occurrences:   1   Protime-INR    Standing Status:   Standing    Number of Occurrences:   1   Urinalysis, Routine w reflex microscopic -Urine, Unspecified Source    Standing Status:   Standing    Number of Occurrences:   1    Order Specific Question:   Specimen Source    Answer:   Urine, Unspecified Source [77]   Lactic acid, plasma    Standing Status:   Standing    Number of Occurrences:   1   Comprehensive metabolic panel    Standing Status:   Standing    Number of Occurrences:   1   CBC    Standing Status:   Standing    Number of Occurrences:   1   Hemoglobin A1c    To assess prior glycemic  control    Standing Status:   Standing    Number of Occurrences:   1   HIV Antibody (routine testing w rflx)    Standing Status:   Standing    Number of Occurrences:   1   Diet heart healthy/carb modified Room service appropriate? Yes; Fluid consistency: Thin    Standing Status:   Standing    Number of Occurrences:   1    Order Specific Question:   Diet-HS Snack?    Answer:   Nothing    Order Specific Question:   Room service appropriate?    Answer:   Yes    Order Specific Question:   Fluid consistency:    Answer:   Thin   Notify physician (specify)  Specify: Notify provider for possible Code Sepsis    Standing Status:   Standing    Number of Occurrences:   1    Order Specific Question:   Specify    Answer:   Notify provider for possible Code Sepsis   Document height and weight    Standing Status:   Standing    Number of Occurrences:   1   Apply Diabetes Mellitus Care Plan    Standing Status:   Standing    Number of Occurrences:   1   STAT CBG when hypoglycemia is suspected. If treated, recheck every 15 minutes after each treatment until CBG >/= 70 mg/dl    Standing Status:   Standing    Number of Occurrences:   1   Refer to Hypoglycemia Protocol Sidebar Report for treatment of  CBG < 70 mg/dl    Standing Status:   Standing    Number of Occurrences:   1   No HS correction Insulin    Standing Status:   Standing    Number of Occurrences:   1   Maintain IV access    Standing Status:   Standing    Number of Occurrences:   1   Vital signs    Standing Status:   Standing    Number of Occurrences:   1   Notify physician (specify)    Standing Status:   Standing    Number of Occurrences:   20    Order Specific Question:   Notify Physician    Answer:   for pulse less than 55 or greater than 120    Order Specific Question:   Notify Physician    Answer:   for respiratory rate less than 12 or greater than 25    Order Specific Question:   Notify Physician    Answer:   for temperature  greater than 100.5 F    Order Specific Question:   Notify Physician    Answer:   for urinary output less than 30 mL/hr for four hours    Order Specific Question:   Notify Physician    Answer:   for systolic BP less than 90 or greater than 160, diastolic BP less than 60 or greater than 100    Order Specific Question:   Notify Physician    Answer:   for new hypoxia w/ oxygen saturations < 88%   Progressive Mobility Protocol: No Restrictions    Standing Status:   Standing    Number of Occurrences:   1   Daily weights    Standing Status:   Standing    Number of Occurrences:   1   Intake and Output    Standing Status:   Standing    Number of Occurrences:   1   Do not place and if present remove PureWick    Standing Status:   Standing    Number of Occurrences:   1   Initiate Oral Care Protocol    Standing Status:   Standing    Number of Occurrences:   1   Initiate Carrier Fluid Protocol    Standing Status:   Standing    Number of Occurrences:   1   RN may order General Admission PRN Orders utilizing "General Admission PRN medications" (through manage orders) for the following patient needs: allergy symptoms (Claritin), cold sores (Carmex), cough (Robitussin DM), eye irritation (Liquifilm Tears), hemorrhoids (Tucks), indigestion (Maalox), minor skin irritation (Hydrocortisone Cream), muscle pain (Ben Gay), nose irritation (saline nasal spray) and sore throat (Chloraseptic spray).    Standing Status:   Standing    Number of Occurrences:   [id number]   Cardiac Monitoring Continuous x 48 hours Indications for use: Other; Other indications for use: Suspect sepsis    Standing Status:   Standing    Number of Occurrences:   1    Order Specific Question:   Indications for use:    Answer:   Other    Order Specific Question:   Other indications for use:    Answer:   Suspect sepsis   Full code    Standing Status:   Standing    Number of Occurrences:   1    Order Specific Question:   By:    Answer:    Other   Consult to hospitalist  Standing Status:   Standing    Number of Occurrences:   1    Order Specific Question:   Place call to:    Answer:   Hospitalist    Order Specific Question:   Reason for Consult    Answer:   Admit   vancomycin per pharmacy consult    Standing Status:   Standing    Number of Occurrences:   1    Order Specific Question:   Indication:    Answer:   Wound Infection   CeFEPIme (MAXIPIME) per pharmacy consult             Standing Status:   Standing    Number of Occurrences:   1    Order Specific Question:   Antibiotic Indication:    Answer:   Wound Infection   Pulse oximetry check with vital signs    Standing Status:   Standing    Number of Occurrences:   1   Oxygen therapy Mode or (Route): Nasal cannula; Liters Per Minute: 2; Keep 02 saturation: greater than 92 %    Standing Status:   Standing    Number of Occurrences:   20    Order Specific Question:   Mode or (Route)    Answer:   Nasal cannula    Order Specific Question:   Liters Per Minute    Answer:   2    Order Specific Question:   Keep 02 saturation    Answer:   greater than 92 %   Place in observation (patient's expected length of stay will be less than 2 midnights)    Standing Status:   Standing    Number of Occurrences:   1    Order Specific Question:   Hospital Area    Answer:   [facility] [[id number]]    Order Specific Question:   Level of Care    Answer:   Telemetry Medical [104]    Order Specific Question:   Covid Evaluation    Answer:   Asymptomatic - no recent exposure (last 10 days) testing not required    Order Specific Question:   Diagnosis    Answer:   Chronic abdominal wound infection, sequela [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]    Order Specific Question:   Admitting Physician    Answer:   Darrold Junker    Order Specific Question:   Attending Physician    Answer:   Darrold Junker   Aspiration precautions    Standing Status:   Standing    Number of Occurrences:   1    Fall precautions    Standing Status:   Standing    Number of Occurrences:   1    Meds ordered this encounter  Medications   HYDROmorphone (DILAUDID) injection 1 mg   metroNIDAZOLE (FLAGYL) IVPB 500 mg    Order Specific Question:   Antibiotic Indication:    Answer:   Intra-abdominal Infection   sodium chloride 0.9 % bolus 1,000 mL   ceFEPIme (MAXIPIME) 2 g in sodium chloride 0.9 % 100 mL IVPB    Order Specific Question:   Antibiotic Indication:    Answer:   Sepsis   vancomycin (VANCOREADY) IVPB 2000 mg/400 mL    Order Specific Question:   Indication:    Answer:   Sepsis   iohexol (OMNIPAQUE) 300 MG/ML solution 100 mL   sodium chloride 0.9 % bolus 1,000 mL   HYDROmorphone (DILAUDID) injection 1 mg   HYDROmorphone (DILAUDID)  injection 0.5 mg   metoprolol succinate (TOPROL-XL) 24 hr tablet 100 mg   spironolactone (ALDACTONE) tablet 25 mg   oxyCODONE-acetaminophen (PERCOCET/ROXICET) 5-325 MG per tablet 2 tablet   ondansetron (ZOFRAN-ODT) disintegrating tablet 4 mg   DISCONTD: HYDROcodone-acetaminophen (NORCO/VICODIN) 5-325 MG per tablet 2 tablet   nystatin-triamcinolone ointment (MYCOLOG) 1 Application   insulin aspart (novoLOG) injection 0-15 Units    Order Specific Question:   Correction coverage:    Answer:   Moderate (average weight, post-op)    Order Specific Question:   CBG < 70:    Answer:   implement hypoglycemia protocol    Order Specific Question:   CBG 70 - 120:    Answer:   0 units    Order Specific Question:   CBG 121 - 150:    Answer:   2 units    Order Specific Question:   CBG 151 - 200:    Answer:   3 units    Order Specific Question:   CBG 201 - 250:    Answer:   5 units    Order Specific Question:   CBG 251 - 300:    Answer:   8 units    Order Specific Question:   CBG 301 - 350:    Answer:   11 units    Order Specific Question:   CBG 351 - 400:    Answer:   15 units    Order Specific Question:   CBG > 400    Answer:   call MD and obtain STAT lab  verification   heparin injection 5,000 Units   sodium chloride flush (NS) 0.9 % injection 3 mL   0.9 %  sodium chloride infusion   OR Linked Order Group    acetaminophen (TYLENOL) tablet 650 mg    acetaminophen (TYLENOL) suppository 650 mg   HYDROcodone-acetaminophen (NORCO/VICODIN) 5-325 MG per tablet 1 tablet   ceFEPIme (MAXIPIME) 2 g in sodium chloride 0.9 % 100 mL IVPB    Order Specific Question:   Antibiotic Indication:    Answer:   Wound Infection   vancomycin (VANCOREADY) IVPB 1500 mg/300 mL    Order Specific Question:   Indication:    Answer:   Wound Infection    Admission Imaging : CT ABDOMEN PELVIS W CONTRAST  Result Date: 02/20/2023 CLINICAL DATA:  Acute abdominal pain. History of cancer with fissures. EXAM: CT ABDOMEN AND PELVIS WITH CONTRAST TECHNIQUE: Multidetector CT imaging of the abdomen and pelvis was performed using the standard protocol following bolus administration of intravenous contrast. RADIATION DOSE REDUCTION: This exam was performed according to the departmental dose-optimization program which includes automated exposure control, adjustment of the mA and/or kV according to patient size and/or use of iterative reconstruction technique. CONTRAST:  OMNIPAQUE IOHEXOL 300 MG/ML  SOLN COMPARISON:  None Available. FINDINGS: Lower chest: There is atelectasis in the lung bases Hepatobiliary: Small stones or gallbladder sludge present. No biliary ductal dilatation. The liver is within normal limits. Pancreas: Unremarkable. No pancreatic ductal dilatation or surrounding inflammatory changes. Spleen: Normal in size without focal abnormality. Adrenals/Urinary Tract: Patient is status post cystectomy with right lower quadrant diverting ostomy. There is moderate bilateral hydronephrosis, left greater than right without obstructing calculus identified. There is no perinephric stranding or fluid. The adrenal glands are within normal limits. Stomach/Bowel: There is no bowel  obstruction or free air. Contrast is seen throughout the colon to the level the rectum. There is no free air. Multiple small bowel loops approximate the  lower anterior abdominal wall. The appendix is not seen. Stomach is within normal limits. Vascular/Lymphatic: No significant vascular findings are present. No enlarged abdominal or pelvic lymph nodes. Reproductive: Status post hysterectomy. No adnexal masses. Other: There is skin thickening and scarring in the midline lower abdominal wall. There is ill-defined fluid and a few small foci of air within this region image 2/69. There is a rounded enhancing fluid collection in the left inguinal region measuring 2.5 by 2.5 by 3.3 cm. There is no ascites. Presacral edema is present. Musculoskeletal: Degenerative changes affect the spine IMPRESSION: 1. Patient is status post cystectomy with right lower quadrant diverting ostomy. There is moderate bilateral hydronephrosis, left greater than right. No obstructing calculus identified. 2. Skin thickening and scarring in the midline lower abdominal wall with ill-defined fluid and a few small foci of air. Findings may be related to postoperative fluid collection, air or possibly fistula given underlying small-bowel loops approximating the anterior abdominal wall. 3. Enhancing fluid collection in the left inguinal region measuring up to 3.3 cm. This may represent postoperative seroma or abscess. 4. Presacral edema. 5. No bowel obstruction or free air. Electronically Signed   By: Darliss Cheney M.D.   On: 02/20/2023 23:10   DG Chest 2 View  Result Date: 02/20/2023 CLINICAL DATA:  Lower mid abdominal wound with drainage, suspected sepsis EXAM: CHEST - 2 VIEW COMPARISON:  None Available. FINDINGS: Frontal and lateral views of the chest demonstrate an enlarged cardiac silhouette. No airspace disease, effusion, or pneumothorax. Right-sided PICC tip in the region of the right atrium. No acute bony abnormalities. IMPRESSION: 1. No  acute intrathoracic process. 2. Enlarged cardiac silhouette. Electronically Signed   By: Sharlet Salina M.D.   On: 02/20/2023 18:41   Physical Examination: Vitals:   02/20/23 2215 02/20/23 2230 02/20/23 2245 02/21/23 0015  BP: (!) 187/92 (!) 167/78 (!) 155/85 (!) 160/94  Pulse: (!) 105 (!) 107 (!) 111   Temp:      Resp:      Height:      Weight:      SpO2: 98% 96% 97%   TempSrc:      BMI (Calculated):       Physical Exam Vitals and nursing note reviewed.  Constitutional:      General: She is not in acute distress.    Appearance: Normal appearance. She is not ill-appearing, toxic-appearing or diaphoretic.  HENT:     Head: Normocephalic and atraumatic.     Right Ear: Hearing and external ear normal.     Left Ear: Hearing and external ear normal.     Nose: Nose normal. No nasal deformity.     Mouth/Throat:     Lips: Pink.     Mouth: Mucous membranes are moist.     Tongue: No lesions.     Pharynx: Oropharynx is clear.  Eyes:     Extraocular Movements: Extraocular movements intact.  Neck:     Vascular: No carotid bruit.  Cardiovascular:     Rate and Rhythm: Normal rate and regular rhythm.     Pulses: Normal pulses.     Heart sounds: Normal heart sounds.  Pulmonary:     Effort: Pulmonary effort is normal.     Breath sounds: Normal breath sounds.  Abdominal:     General: There is no distension.     Tenderness: There is no abdominal tenderness.     Comments: Urostomy. + Abd distended. Areas of open sores .  Musculoskeletal:  Right lower leg: No edema.     Left lower leg: No edema.  Skin:    General: Skin is warm.  Neurological:     General: No focal deficit present.     Mental Status: She is alert and oriented to person, place, and time.     Cranial Nerves: Cranial nerves 2-12 are intact.     Motor: Motor function is intact.  Psychiatric:        Attention and Perception: Attention normal.        Mood and Affect: Mood normal.        Speech: Speech normal.         Behavior: Behavior normal. Behavior is cooperative.        Cognition and Memory: Cognition normal.    Assessment and Plan: Anemia    Latest Ref Rng & Units 02/20/2023    6:10 PM 09/01/2022    1:05 AM 11/04/2021    9:31 PM  CBC  WBC 4.0 - 10.5 K/uL 6.4  5.7  6.4   Hemoglobin 12.0 - 15.0 g/dL 9.1  16.1  09.6   Hematocrit 36.0 - 46.0 % 28.7  35.3  34.8   Platelets 150 - 400 K/uL 444  265  241   Hemoglobin is 9.1 today lower then his November Blood work however we will follow Type/scree/ iv ppi.  Avoid NSIADS.   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 Will continue with IV antibiotics and general surgery consulted AMS and sepsis.  Obstructive sleep apnea (adult) (pediatric) Continue CPAP per home settings.   Essential hypertension Vitals:   02/20/23 1805 02/20/23 1815 02/20/23 2115 02/20/23 2130  BP: (!) 185/136 (!) 159/79 (!) 175/98 (!) 165/92   02/20/23 2145 02/20/23 2200 02/20/23 2215 02/20/23 2230  BP: (!) 165/93 (!) 171/93 (!) 187/92 (!) 167/78   02/20/23 2245  BP: (!) 155/85  We will continue patient on metoprolol Aldactone.    Depression, unspecified (We will continue once med reconciliation is available.  Chronic kidney disease, stage 2 (mild) Lab Results  Component Value Date   CREATININE 1.01 (H) 02/20/2023   CREATININE 0.90 09/01/2022   CREATININE 0.88 11/04/2021  Monitor / renally dose/ avoid contrast. And follow.      DVT prophylaxis:  Heparin Code Status:  Full code    02/20/2023    6:08 PM  Advanced Directives  Does Patient Have a Medical Advance Directive? No  Would patient like information on creating a medical advance directive? No - Patient declined    Family Communication:  None Emergency Contact: Contact Information     Name Relation Home Work Mobile   spence,feirra Daughter   [phone]       Disposition Plan:  To be determined Consults: General surgery: Dr.  Claudine Mouton Admission status: Observation Unit / Expected LOS: 1 to 2 days.  Gertha Calkin MD [facility]  6 PM- 2 AM. [phone]( Pager )  For questions regarding this patient please use [email or web address] to contact the current [facility] MD.   Bonita Quin may also call [phone] to contact current Assigned [facility] Attending/Consulting MD for this patient.

## 2023-02-22 LAB — BASIC METABOLIC PANEL
Anion gap: 7 (ref 5–15)
BUN: 21 mg/dL — ABNORMAL HIGH (ref 6–20)
CO2: 25 mmol/L (ref 22–32)
Calcium: 8.5 mg/dL — ABNORMAL LOW (ref 8.9–10.3)
Chloride: 107 mmol/L (ref 98–111)
Creatinine, Ser: 1.11 mg/dL — ABNORMAL HIGH (ref 0.44–1.00)
GFR, Estimated: 60 mL/min — ABNORMAL LOW (ref >60)
Glucose, Bld: 87 mg/dL (ref 70–99)
Potassium: 3.7 mmol/L (ref 3.5–5.1)
Sodium: 139 mmol/L (ref 135–145)

## 2023-02-22 LAB — CBC
HCT: 24.6 % — ABNORMAL LOW (ref 36.0–46.0)
Hemoglobin: 7.9 g/dL — ABNORMAL LOW (ref 12.0–15.0)
MCH: 29.4 pg (ref 26.0–34.0)
MCHC: 32.1 g/dL (ref 30.0–36.0)
MCV: 91.4 fL (ref 80.0–100.0)
Platelets: 383 10*3/uL (ref 150–400)
RBC: 2.69 MIL/uL — ABNORMAL LOW (ref 3.87–5.11)
RDW: 15.1 % (ref 11.5–15.5)
WBC: 4.7 10*3/uL (ref 4.0–10.5)
nRBC: 0 % (ref 0.0–0.2)

## 2023-02-22 LAB — CULTURE, BLOOD (ROUTINE X 2)

## 2023-02-22 LAB — GLUCOSE, CAPILLARY
Glucose-Capillary: 116 mg/dL — ABNORMAL HIGH (ref 70–99)
Glucose-Capillary: 113 mg/dL — ABNORMAL HIGH (ref 70–99)

## 2023-02-22 MED ORDER — CHLORHEXIDINE GLUCONATE CLOTH 2 % EX PADS: 6.0000 | MEDICATED_PAD | Freq: Every day | CUTANEOUS | 0 refills | Status: AC

## 2023-02-22 MED ORDER — ZINC OXIDE 40 % EX PSTE: 1.0000 | PASTE | Freq: Every day | CUTANEOUS | 0 refills | Status: AC

## 2023-02-22 MED ORDER — SULFAMETHOXAZOLE-TRIMETHOPRIM 800-160 MG PO TABS: 1.0000 | ORAL_TABLET | Freq: Two times a day (BID) | ORAL | 0 refills | Status: AC

## 2023-02-22 MED ORDER — HYDROCODONE-ACETAMINOPHEN 5-325 MG PO TABS: 1.0000 | ORAL_TABLET | Freq: Four times a day (QID) | ORAL | 0 refills | Status: AC | PRN

## 2023-02-22 NOTE — TOC Transition Note (Signed)
Transition of Care [facility]) - CM/SW Discharge Note   Patient Details  Name: Cassandra Russo MRN: [id number] Date of Birth: [birth year]  Transition of Care [facility]) CM/SW Contact:  Bing Quarry, RN Phone Number: 02/22/2023, 11:54 AM   Clinical Narrative:  02/22/23: Patient admitted 02/20/23 on chronic TPN at home and followed/active with [facility] and [facility] for TPN at home. TPN was to be started today per RD notes on 02/21/23. Discharge orders in as patient states she gives her own infusions and has supply at home. Contacting [facility] to alert to discharge/any urgent TPN needs. RN/OT ordered for resumption of HH services via [facility]. Contacted [facility] and will fax order to [phone] Attention: Toney Reil, who will give to infusion team and made aware of discharge to home today. Updated provider. Gabriel Cirri RN CM [phone]    Final next level of care: Home w Home Health Services Barriers to Discharge: Barriers Resolved   Patient Goals and CMS Choice      Discharge Placement                         Discharge Plan and Services Additional resources added to the After Visit Summary for                  DME Arranged: N/A DME Agency:  (Active with [facility] and [facility] for TPN.[facility]   [address]   [address]   [place], [place] [address]   [phone]) Date DME Agency Contacted: 02/22/23 Time DME Agency Contacted: [phone] Representative spoke with at DME Agency:  (Called to confirm restart of service and TPN needs. Patient states she has more at home.) HH Arranged: PT, RN (Resumption orders for [facility] services.) [facility] Agency: Other - See comment [facility]   [address]   [address]   [place], [place] [address]   [phone]) Date [facility] Agency Contacted: 02/22/23 [facility]   [address]   [address]   [place], [place] [address]   [phone]) Time [facility] Agency Contacted: [phone] Representative spoke  with at [facility] Agency: Referral Team  Social Determinants of Health (SDOH) Interventions SDOH Screenings   Food Insecurity: No Food Insecurity (02/21/2023)  Housing: Low Risk  (02/21/2023)  Transportation Needs: No Transportation Needs (02/21/2023)  Utilities: Not At Risk (02/21/2023)  Tobacco Use: Low Risk  (02/20/2023)     Readmission Risk Interventions     No data to display

## 2023-02-22 NOTE — Discharge Summary (Signed)
Physician Discharge Summary   Patient: Cassandra Russo MRN: [id number]  DOB: 1971/07/26   Admit:     Date of Admission: 02/20/2023 Admitted from: home   Discharge: Date of discharge: 02/22/23 Disposition: Home health Condition at discharge: good  CODE STATUS: FULL CODE     Discharge Physician: Sunnie Nielsen, DO [facility]     PCP: Patient, No Pcp Per  Recommendations for Outpatient Follow-up:  Follow up with PCP 2-4 weeks Has upcoming appt w/ [facility] for chronic wound care 05/10 Please obtain labs/tests: consdier CBC, BMP, other labs as indicated at follow-up Please follow up on the following pending results: none PCP AND OTHER OUTPATIENT PROVIDERS: SEE BELOW FOR SPECIFIC DISCHARGE INSTRUCTIONS PRINTED FOR PATIENT IN ADDITION TO GENERIC AVS PATIENT INFO    Discharge Instructions     Diet - low sodium heart healthy   Complete by: As directed    Discharge wound care:   Complete by: As directed    Keep abdominal wounds clean and keep as dry as possible, keep covered, ok to apply zinc oxide to areas of skin irritation   Increase activity slowly   Complete by: As directed          Discharge Diagnoses: Principal Problem:   Chronic abdominal wound infection, sequela Active Problems:   Anemia   Enterocutaneous fistula   Chronic kidney disease, stage 2 (mild)   Depression, unspecified   Essential hypertension   Obstructive sleep apnea (adult) (pediatric)       Hospital Course: Cassandra Russo is a 52 y.o. 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  The patient states that over the last several days, she has had progressively worsening aching, throbbing, severe pain along her midline abdominal scar.  She has had increased drainage.   05/03: in ED, BP 185/136 and HR 113 in triage. On exam multiple open, draining wounds along the  midline abdomen, draining feculent material. purulent material along the inferior most aspect of the wound as well. Diffuse, severe tenderness to palpation with some slight skin induration. Right diverting urostomy in place. CBC/CMP largely unremarkable. UA question contamination. BCx collected. CXR non-acute. CT abdomen/pelvis: s/p cystectomy with diverting ostomy, skin thickening and scarring midline lower abdominal wall with enterocutaneous fistula, small abscess vs seroma in L inguinal area - no I&D done since already draining. Tx: pain control, cefepime, vancomycin, metronidazole, wound care. Admitted to hospitalist service for IV abx and pain control.  05/04: pending BCx. Restarting TPN - can't get TPN until tomorrow, placed on D10. Await BCx. D/w general surgery Dr Toula Moos - defer official consult but he has reviewed imaging/photos, suspect L groin seroma and this can be addressed by IR if needed, anterior abdominal wall does not appear to require any surgical intervention.  05/05: Hgb trend down some to 7.9, renal fxn stable, BCx NGx2d, ok for discharge on po abx, resume home health and TPN at home (pt states she has supplies and can manage this today)  Consultants:  General surgery   Procedures: none    ASSESSMENT & PLAN:   Chronic abdominal wound infection, sequela Enterocutaneous fistula - sequelae of previous tx for hx cervical cancer  NO sepsis  follows w/ [facility]  No surgical intervention needed at this time  Vancomycin + Cefepime pending cultures --> BCx negative, x2d  Wound care  Pain control  Bactrim on discharge Follow w/ [facility]  Anemia likely d/t chronic inflammation Monitor CBC No bleeding, monitor closely    Chronic kidney disease, stage 2 (mild) Stable  Monitor BMP  Depression, unspecified Follow outpatient   Essential hypertension Continue home metoprolol Adjust as needed  Obstructive sleep apnea (adult)  CPAP qhs           Discharge  Instructions  Allergies as of 02/22/2023       Reactions   Irbesartan Swelling, Other (See Comments)   Amlodipine Swelling        Medication List     STOP taking these medications    oxyCODONE-acetaminophen 5-325 MG tablet Commonly known as: Percocet       TAKE these medications    Chlorhexidine Gluconate Cloth 2 % Pads Apply 6 each topically daily. Start taking on: Feb 23, 2023   HYDROcodone-acetaminophen 5-325 MG tablet Commonly known as: NORCO/VICODIN Take 1-2 tablets by mouth every 6 (six) hours as needed for moderate pain or severe pain. What changed: how much to take   metoprolol succinate 100 MG 24 hr tablet Commonly known as: TOPROL-XL Take 100 mg by mouth daily.   naproxen 500 MG tablet Commonly known as: Naprosyn Take 1 tablet (500 mg total) by mouth 2 (two) times daily with a meal.   nystatin-triamcinolone ointment Commonly known as: MYCOLOG Apply 1 Application topically 2 (two) times daily.   ondansetron 4 MG disintegrating tablet Commonly known as: ZOFRAN-ODT Take 1 tablet (4 mg total) by mouth every 6 (six) hours as needed for nausea or vomiting.   spironolactone 25 MG tablet Commonly known as: ALDACTONE Take 25 mg by mouth daily.   sulfamethoxazole-trimethoprim 800-160 MG tablet Commonly known as: BACTRIM DS Take 1 tablet by mouth 2 (two) times daily for 7 days.   Zinc Oxide 40 % Pste Apply 1 Application topically daily. To areas of skin irritation               Discharge Care Instructions  (From admission, onward)           Start     Ordered   02/22/23 0000  Discharge wound care:       Comments: Keep abdominal wounds clean and keep as dry as possible, keep covered, ok to apply zinc oxide to areas of skin irritation   02/22/23 1050              Allergies  Allergen Reactions   Irbesartan Swelling and Other (See Comments)   Amlodipine Swelling     Subjective: pt reports pain is significantly improved today     Discharge Exam: BP 135/83 (BP Location: Left Arm)   Pulse 99   Temp 98.6 F (37 C)   Resp 16   Ht 5\' 5"  (1.651 m)   Wt 86.2 kg   SpO2 94%   BMI 31.62 kg/m  General: Pt is alert, awake, not in acute distress Cardiovascular: RRR, S1/S2 +, no rubs, no gallops Respiratory: CTA bilaterally, no wheezing, no rhonchi Abdominal: Soft, NT, ND, bowel sounds + Extremities: no edema, no cyanosis      The results of significant diagnostics from this hospitalization (including imaging, microbiology, ancillary and laboratory) are listed below for reference.     Microbiology: Recent Results (from the past 240 hour(s))  Culture, blood (Routine x 2)     Status: None (Preliminary result)   Collection Time: 02/20/23  6:22 PM   Specimen: BLOOD RIGHT HAND  Result Value Ref Range Status   Specimen Description BLOOD RIGHT HAND  Final   Special Requests   Final    BOTTLES DRAWN AEROBIC AND ANAEROBIC Blood Culture adequate volume   Culture   Final    NO GROWTH 2 DAYS Performed at [facility], [address] [place], [place] [address]    Report Status PENDING  Incomplete  Culture, blood (Routine x 2)     Status: None (Preliminary result)   Collection Time: 02/20/23  6:25 PM   Specimen: BLOOD  Result Value Ref Range Status   Specimen Description BLOOD LEFT ANTECUBITAL  Final   Special Requests   Final    BOTTLES DRAWN AEROBIC AND ANAEROBIC Blood Culture adequate volume   Culture   Final    NO GROWTH 2 DAYS Performed at [facility], [address] [place], [place] [address]    Report Status PENDING  Incomplete     Labs: BNP (last 3 results) No results for input(s): "BNP" in the last 8760 hours. Basic Metabolic Panel: Recent Labs  Lab 02/20/23 1810 02/22/23 0453  NA 137 139  K 4.2 3.7  CL 102 107  CO2 24 25  GLUCOSE 93 87  BUN 41* 21*  CREATININE 1.01* 1.11*  CALCIUM 9.1 8.5*   Liver Function Tests: Recent Labs  Lab 02/20/23 1810  AST 31  ALT  33  ALKPHOS 220*  BILITOT 0.7  PROT 7.8  ALBUMIN 2.8*   No results for input(s): "LIPASE", "AMYLASE" in the last 168 hours. No results for input(s): "AMMONIA" in the last 168 hours. CBC: Recent Labs  Lab 02/20/23 1810 02/22/23 0453  WBC 6.4 4.7  NEUTROABS 3.7  --   HGB 9.1* 7.9*  HCT 28.7* 24.6*  MCV 91.7 91.4  PLT 444* 383   Cardiac Enzymes: No results for input(s): "CKTOTAL", "CKMB", "CKMBINDEX", "TROPONINI" in the last 168 hours. BNP: Invalid input(s): "POCBNP" CBG: Recent Labs  Lab 02/21/23 1324 02/21/23 1610 02/21/23 2057 02/22/23 0821 02/22/23 1140  GLUCAP 77 87 86 116* 113*   D-Dimer No results for input(s): "DDIMER" in the last 72 hours. Hgb A1c Recent Labs    02/21/23 0416  HGBA1C 5.1   Lipid Profile No results for input(s): "CHOL", "HDL", "LDLCALC", "TRIG", "CHOLHDL", "LDLDIRECT" in the last 72 hours. Thyroid function studies No results for input(s): "TSH", "T4TOTAL", "T3FREE", "THYROIDAB" in the last 72 hours.  Invalid input(s): "FREET3" Anemia work up No results for input(s): "VITAMINB12", "FOLATE", "FERRITIN", "TIBC", "IRON", "RETICCTPCT" in the last 72 hours. Urinalysis    Component Value Date/Time   COLORURINE YELLOW (A) 02/20/2023 1824   APPEARANCEUR HAZY (A) 02/20/2023 1824   LABSPEC 1.014 02/20/2023 1824   PHURINE 6.0 02/20/2023 1824   GLUCOSEU NEGATIVE 02/20/2023 1824   HGBUR MODERATE (A) 02/20/2023 1824   BILIRUBINUR NEGATIVE 02/20/2023 1824   KETONESUR NEGATIVE 02/20/2023 1824   PROTEINUR 30 (A) 02/20/2023 1824   NITRITE NEGATIVE 02/20/2023 1824   LEUKOCYTESUR NEGATIVE 02/20/2023 1824   Sepsis Labs Recent Labs  Lab 02/20/23 1810 02/22/23 0453  WBC 6.4 4.7   Microbiology Recent Results (from the past 240 hour(s))  Culture, blood (Routine x 2)     Status: None (Preliminary result)   Collection Time: 02/20/23  6:22 PM   Specimen: BLOOD RIGHT HAND  Result Value Ref Range Status   Specimen Description BLOOD RIGHT HAND   Final   Special Requests   Final    BOTTLES DRAWN AEROBIC AND ANAEROBIC Blood Culture adequate volume   Culture   Final    NO GROWTH 2 DAYS  Performed at [facility], [address] [place], [place] [address]    Report Status PENDING  Incomplete  Culture, blood (Routine x 2)     Status: None (Preliminary result)   Collection Time: 02/20/23  6:25 PM   Specimen: BLOOD  Result Value Ref Range Status   Specimen Description BLOOD LEFT ANTECUBITAL  Final   Special Requests   Final    BOTTLES DRAWN AEROBIC AND ANAEROBIC Blood Culture adequate volume   Culture   Final    NO GROWTH 2 DAYS Performed at [facility], [address], [place], [place] [address]    Report Status PENDING  Incomplete   Imaging CT ABDOMEN PELVIS W CONTRAST  Result Date: 02/20/2023 CLINICAL DATA:  Acute abdominal pain. History of cancer with fissures. EXAM: CT ABDOMEN AND PELVIS WITH CONTRAST TECHNIQUE: Multidetector CT imaging of the abdomen and pelvis was performed using the standard protocol following bolus administration of intravenous contrast. RADIATION DOSE REDUCTION: This exam was performed according to the departmental dose-optimization program which includes automated exposure control, adjustment of the mA and/or kV according to patient size and/or use of iterative reconstruction technique. CONTRAST:  OMNIPAQUE IOHEXOL 300 MG/ML  SOLN COMPARISON:  None Available. FINDINGS: Lower chest: There is atelectasis in the lung bases Hepatobiliary: Small stones or gallbladder sludge present. No biliary ductal dilatation. The liver is within normal limits. Pancreas: Unremarkable. No pancreatic ductal dilatation or surrounding inflammatory changes. Spleen: Normal in size without focal abnormality. Adrenals/Urinary Tract: Patient is status post cystectomy with right lower quadrant diverting ostomy. There is moderate bilateral hydronephrosis, left greater than right without obstructing calculus  identified. There is no perinephric stranding or fluid. The adrenal glands are within normal limits. Stomach/Bowel: There is no bowel obstruction or free air. Contrast is seen throughout the colon to the level the rectum. There is no free air. Multiple small bowel loops approximate the lower anterior abdominal wall. The appendix is not seen. Stomach is within normal limits. Vascular/Lymphatic: No significant vascular findings are present. No enlarged abdominal or pelvic lymph nodes. Reproductive: Status post hysterectomy. No adnexal masses. Other: There is skin thickening and scarring in the midline lower abdominal wall. There is ill-defined fluid and a few small foci of air within this region image 2/69. There is a rounded enhancing fluid collection in the left inguinal region measuring 2.5 by 2.5 by 3.3 cm. There is no ascites. Presacral edema is present. Musculoskeletal: Degenerative changes affect the spine IMPRESSION: 1. Patient is status post cystectomy with right lower quadrant diverting ostomy. There is moderate bilateral hydronephrosis, left greater than right. No obstructing calculus identified. 2. Skin thickening and scarring in the midline lower abdominal wall with ill-defined fluid and a few small foci of air. Findings may be related to postoperative fluid collection, air or possibly fistula given underlying small-bowel loops approximating the anterior abdominal wall. 3. Enhancing fluid collection in the left inguinal region measuring up to 3.3 cm. This may represent postoperative seroma or abscess. 4. Presacral edema. 5. No bowel obstruction or free air. Electronically Signed   By: Darliss Cheney M.D.   On: 02/20/2023 23:10   DG Chest 2 View  Result Date: 02/20/2023 CLINICAL DATA:  Lower mid abdominal wound with drainage, suspected sepsis EXAM: CHEST - 2 VIEW COMPARISON:  None Available. FINDINGS: Frontal and lateral views of the chest demonstrate an enlarged cardiac silhouette. No airspace disease,  effusion, or pneumothorax. Right-sided PICC tip in the region of the right atrium. No acute bony  abnormalities. IMPRESSION: 1. No acute intrathoracic process. 2. Enlarged cardiac silhouette. Electronically Signed   By: Sharlet Salina M.D.   On: 02/20/2023 18:41      Time coordinating discharge: over 30 minutes  SIGNED:  Sunnie Nielsen DO [facility]

## 2023-02-22 NOTE — Plan of Care (Signed)
Patients PICC line remained in as patient came to hospital with it.  Discharge instructions reviewed and patient discharged to home with daughter

## 2023-02-23 LAB — CULTURE, BLOOD (ROUTINE X 2): Special Requests: ADEQUATE

## 2023-02-24 LAB — CULTURE, BLOOD (ROUTINE X 2)
Culture: NO GROWTH
Special Requests: ADEQUATE

## 2023-02-25 LAB — CULTURE, BLOOD (ROUTINE X 2)

## 2023-11-16 IMAGING — DX DG KNEE COMPLETE 4+V*R*
4 series · 4 of 4 positions shown · non-contrast
Comparison: None.

CLINICAL DATA: Knee pain and swelling.

EXAM:
RIGHT KNEE - COMPLETE 4+ VIEW

[knee ap]
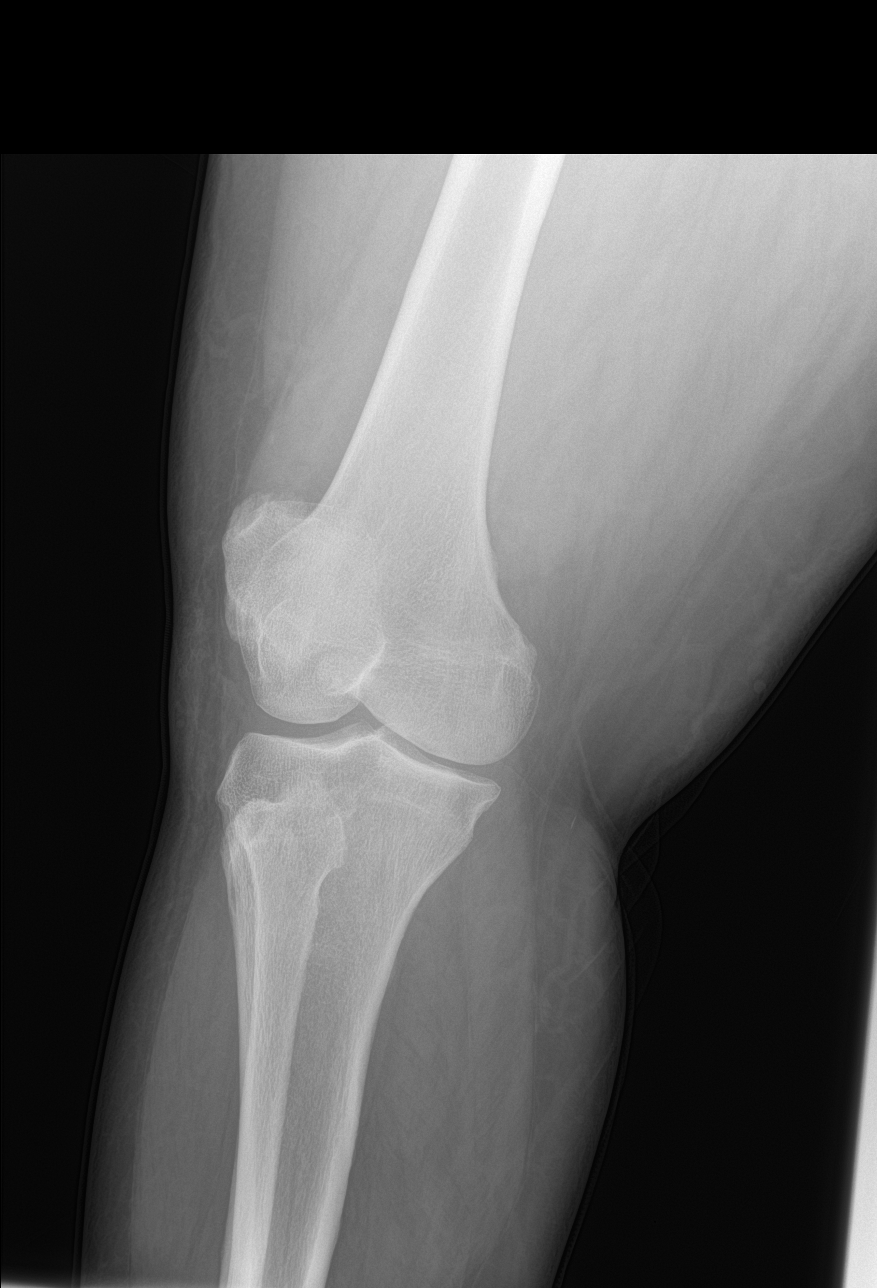

[knee lat]
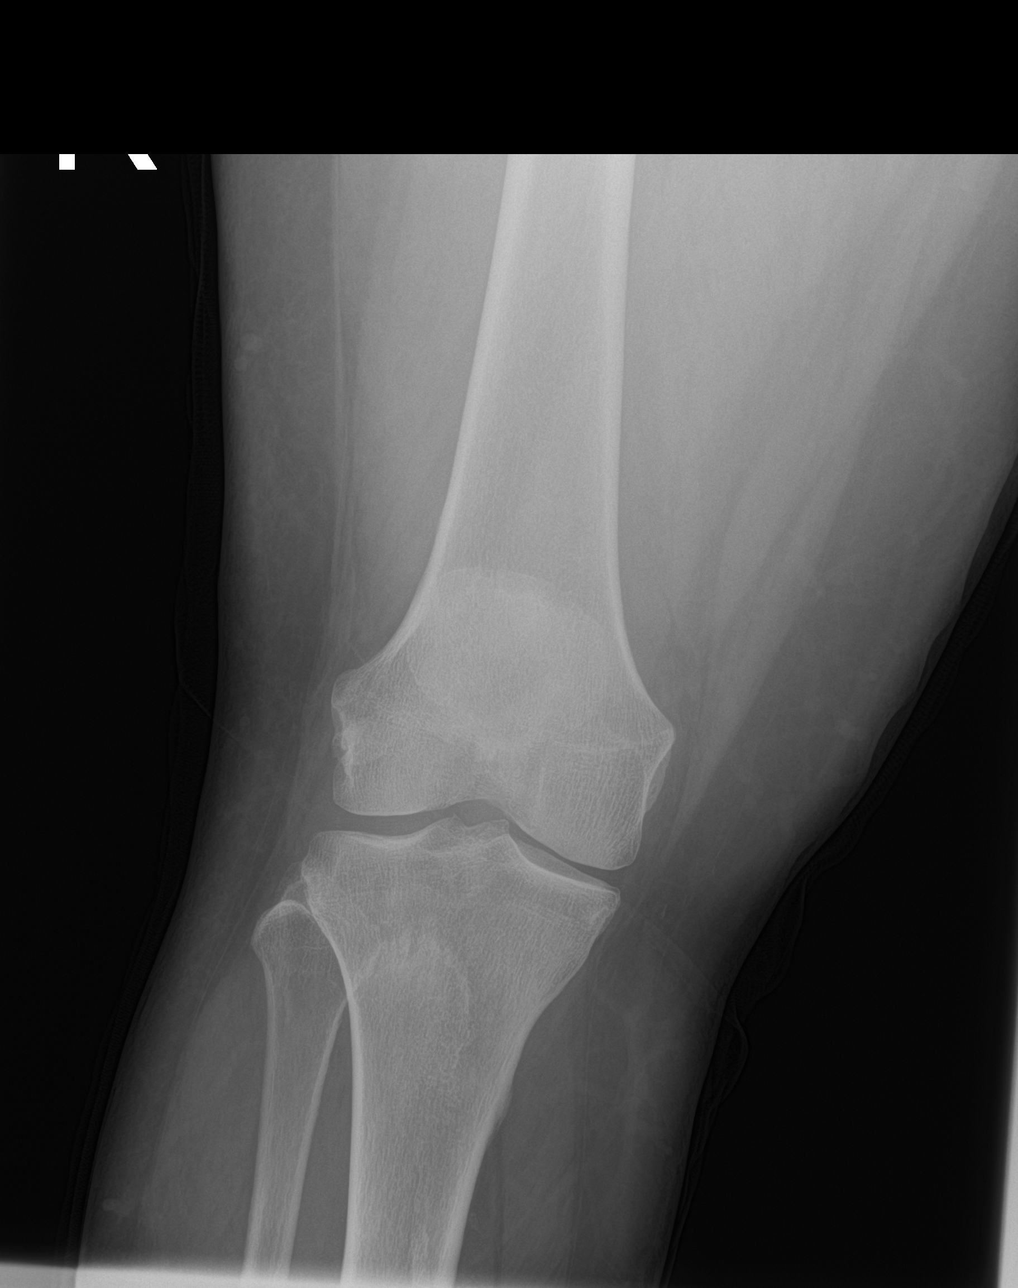

[knee obl (1 of 2)]
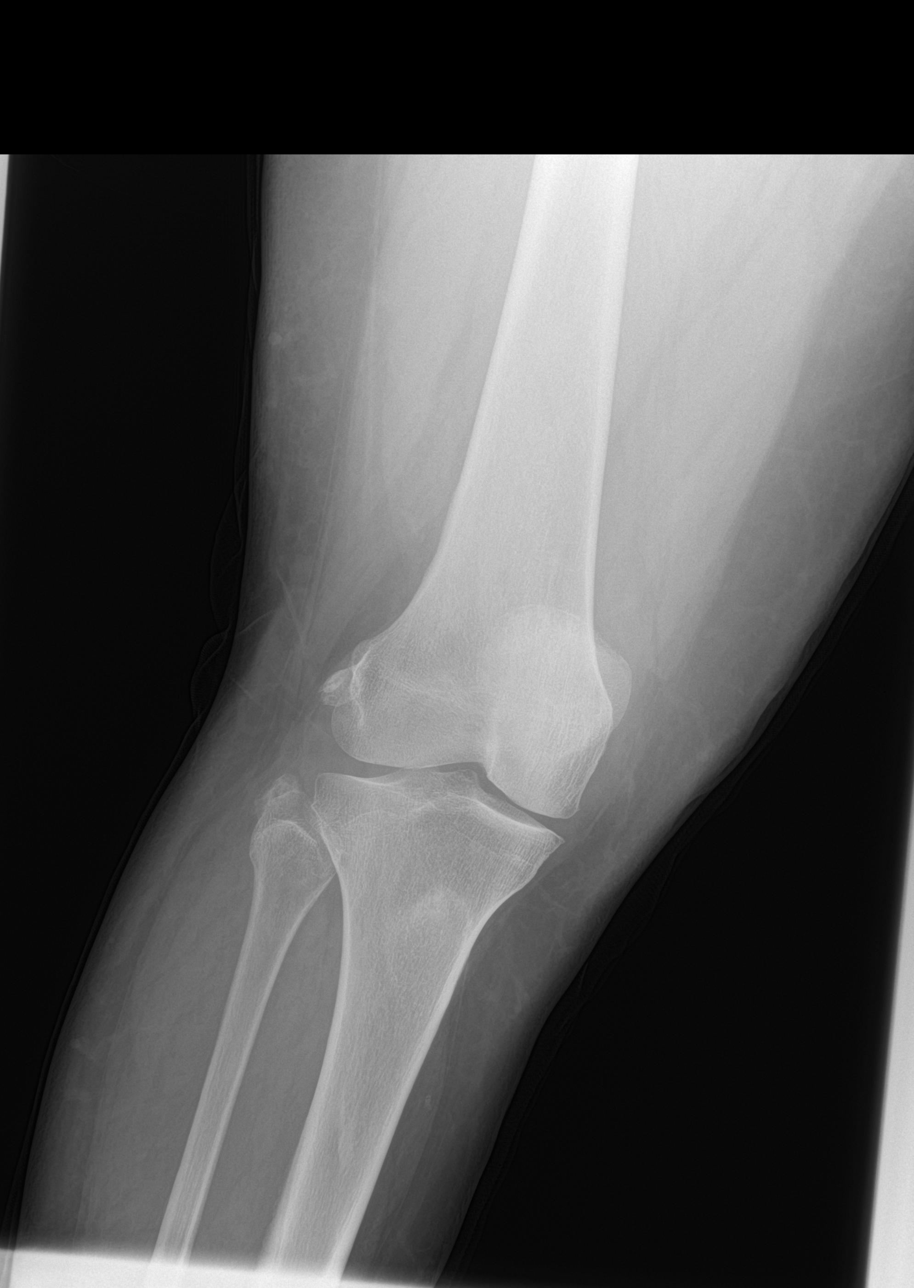

[knee obl (2 of 2)]
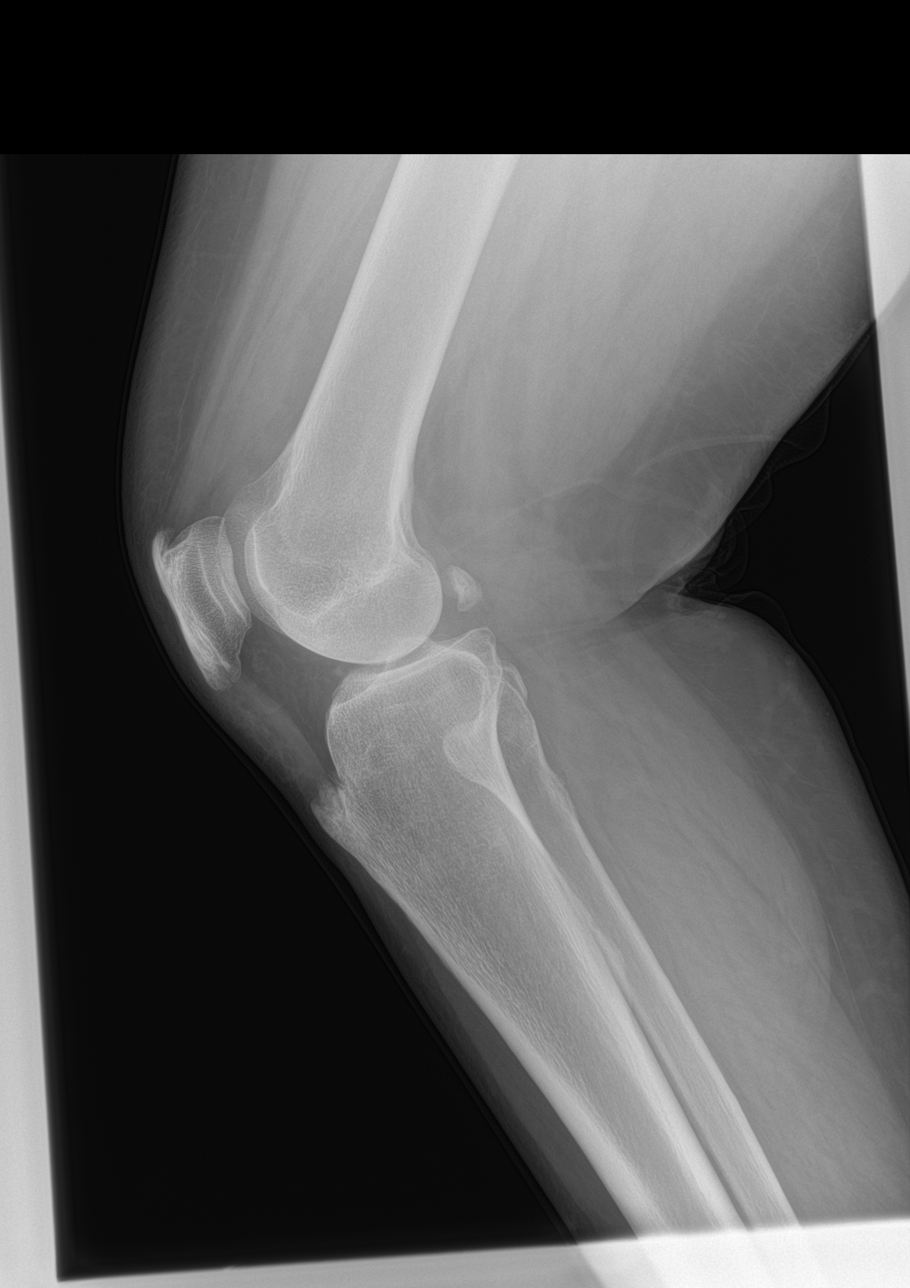

[4 of 4 positions shown; findings below may reference images not displayed]

FINDINGS: There is small to moderate-sized suprapatellar bursal effusion.

Bone mineralization is normal with no evidence of fractures. There
is fabella posterolaterally.

There is enthesopathic spurring of the anterior patella both
superiorly and inferiorly and of the anterior tibial tubercle.

There is mild narrowing and trace spurring of the medial
femorotibial compartment without further findings of arthrosis.

No loose bodies or radiopaque foreign bodies are seen.
IMPRESSION: Enthesopathic changes anteriorly with early degenerative change in
the medial compartment and small to moderate suprapatellar effusion.
No other significant findings.

## 2024-03-04 ENCOUNTER — Emergency Department
Admission: EM | Admit: 2024-03-04 | Discharge: 2024-03-04 | Disposition: A | Discharge status: Home / Self Care | Attending: Emergency Medicine | Admitting: Emergency Medicine

## 2024-03-04 ENCOUNTER — Encounter: Payer: Self-pay | Admitting: Intensive Care

## 2024-03-04 ENCOUNTER — Other Ambulatory Visit: Payer: Self-pay

## 2024-03-04 DIAGNOSIS — Z8541 Personal history of malignant neoplasm of cervix uteri: Secondary | ICD-10-CM | POA: Diagnosis not present

## 2024-03-04 DIAGNOSIS — I129 Hypertensive chronic kidney disease with stage 1 through stage 4 chronic kidney disease, or unspecified chronic kidney disease: Secondary | ICD-10-CM | POA: Insufficient documentation

## 2024-03-04 DIAGNOSIS — R1084 Generalized abdominal pain: Secondary | ICD-10-CM | POA: Insufficient documentation

## 2024-03-04 DIAGNOSIS — R112 Nausea with vomiting, unspecified: Secondary | ICD-10-CM | POA: Insufficient documentation

## 2024-03-04 DIAGNOSIS — N189 Chronic kidney disease, unspecified: Secondary | ICD-10-CM | POA: Diagnosis not present

## 2024-03-04 LAB — CBC
HCT: 35.1 % — ABNORMAL LOW (ref 36.0–46.0)
Hemoglobin: 11.7 g/dL — ABNORMAL LOW (ref 12.0–15.0)
MCH: 30.2 pg (ref 26.0–34.0)
MCHC: 33.3 g/dL (ref 30.0–36.0)
MCV: 90.5 fL (ref 80.0–100.0)
Platelets: 286 10*3/uL (ref 150–400)
RBC: 3.88 MIL/uL (ref 3.87–5.11)
RDW: 13.4 % (ref 11.5–15.5)
WBC: 6.9 10*3/uL (ref 4.0–10.5)
nRBC: 0 % (ref 0.0–0.2)

## 2024-03-04 LAB — COMPREHENSIVE METABOLIC PANEL WITH GFR
ALT: 8 U/L (ref 0–44)
AST: 16 U/L (ref 15–41)
Albumin: 3.9 g/dL (ref 3.5–5.0)
Alkaline Phosphatase: 92 U/L (ref 38–126)
Anion gap: 11 (ref 5–15)
BUN: 19 mg/dL (ref 6–20)
CO2: 27 mmol/L (ref 22–32)
Calcium: 9.1 mg/dL (ref 8.9–10.3)
Chloride: 102 mmol/L (ref 98–111)
Creatinine, Ser: 1.13 mg/dL — ABNORMAL HIGH (ref 0.44–1.00)
GFR, Estimated: 58 mL/min — ABNORMAL LOW (ref >60)
Glucose, Bld: 92 mg/dL (ref 70–99)
Potassium: 3.4 mmol/L — ABNORMAL LOW (ref 3.5–5.1)
Sodium: 140 mmol/L (ref 135–145)
Total Bilirubin: 1.4 mg/dL — ABNORMAL HIGH (ref 0.0–1.2)
Total Protein: 8.3 g/dL — ABNORMAL HIGH (ref 6.5–8.1)

## 2024-03-04 LAB — URINALYSIS, ROUTINE W REFLEX MICROSCOPIC
Bilirubin Urine: NEGATIVE
Glucose, UA: NEGATIVE mg/dL
Ketones, ur: NEGATIVE mg/dL
Nitrite: POSITIVE — AB
Protein, ur: 100 mg/dL — AB
Specific Gravity, Urine: 1.013 (ref 1.005–1.030)
pH: 8 (ref 5.0–8.0)

## 2024-03-04 LAB — LIPASE, BLOOD: Lipase: 27 U/L (ref 11–51)

## 2024-03-04 MED ORDER — MORPHINE SULFATE (PF) 4 MG/ML IV SOLN
4.0000 mg | Freq: Once | INTRAVENOUS | Status: AC
  Administered 2024-03-04: 4 mg via INTRAVENOUS
  Filled 2024-03-04: qty 1

## 2024-03-04 MED ORDER — PANTOPRAZOLE SODIUM 40 MG IV SOLR
40.0000 mg | Freq: Once | INTRAVENOUS | Status: AC
  Administered 2024-03-04: 40 mg via INTRAVENOUS
  Filled 2024-03-04: qty 10

## 2024-03-04 MED ORDER — ONDANSETRON 4 MG PO TBDP
4.0000 mg | ORAL_TABLET | Freq: Once | ORAL | Status: AC | PRN
  Administered 2024-03-04: 4 mg via ORAL
  Filled 2024-03-04: qty 1

## 2024-03-04 MED ORDER — ONDANSETRON HCL 4 MG/2ML IJ SOLN
4.0000 mg | Freq: Once | INTRAMUSCULAR | Status: AC
  Administered 2024-03-04: 4 mg via INTRAVENOUS
  Filled 2024-03-04: qty 2

## 2024-03-04 MED ORDER — SODIUM CHLORIDE 0.9 % IV BOLUS
500.0000 mL | Freq: Once | INTRAVENOUS | Status: AC
  Administered 2024-03-04: 500 mL via INTRAVENOUS

## 2024-03-04 MED ORDER — ONDANSETRON 4 MG PO TBDP: 4.0000 mg | ORAL_TABLET | Freq: Three times a day (TID) | ORAL | 0 refills | Status: DC | PRN

## 2024-03-04 NOTE — ED Triage Notes (Signed)
 Patient from home with c/o abdominal pain with N/V. Reports feeling like it is in knots.   Has a urostomy   History cervical cancer and had stent placed a year ago

## 2024-03-04 NOTE — ED Notes (Signed)
 Pt given liquids to see how she can tolerate po

## 2024-03-04 NOTE — ED Provider Notes (Signed)
 [facility] Provider Note    Event Date/Time   First MD Initiated Contact with Patient 03/04/24 2059     (approximate)   History   Chief Complaint: Abdominal Pain   HPI  Cassandra Russo is a 53 y.o. female with a history of hypertension, cervical cancer CKD, status post cystectomy with urostomy who comes to the ED due to crampy generalized abdominal pain, nausea vomiting that started tonight after eating dinner.  She ate eggs and some sausage that was in the fridge, but her family member notes that the sausage was old and not good for eating anymore and should have been thrown away, and he suspects food poisoning.  Patient was given Zofran  and morphine after triage, and does report that her pain is improving.  She denies any radiation in her back pain or flank pain which are normal features of her pain from urinary obstruction.        Past Medical History:  Diagnosis Date   Cervical cancer [facility])    Hypertension     Current Outpatient Rx   Order #: [id number] Class: Print   Order #: [id number] Class: Normal   Order #: [id number] Class: Normal   Order #: [id number] Class: Historical Med   Order #: [id number] Class: Print   Order #: [id number] Class: Normal   Order #: [id number] Class: Normal   Order #: [id number] Class: Historical Med   Order #: [id number] Class: Normal    History reviewed. No pertinent surgical history.  Physical Exam   Triage Vital Signs: ED Triage Vitals  Encounter Vitals Group     BP 03/04/24 1810 (!) 209/115     Systolic BP Percentile --      Diastolic BP Percentile --      Pulse Rate 03/04/24 1808 82     Resp 03/04/24 1808 18     Temp 03/04/24 1813 98.6 F (37 C)     Temp Source 03/04/24 1813 Oral     SpO2 03/04/24 1808 97 %     Weight 03/04/24 1809 192 lb (87.1 kg)     Height 03/04/24 1809 5\' 5"  (1.651 m)     Head Circumference --      Peak Flow --      Pain Score 03/04/24 1809 10     Pain Loc --      Pain Education --       Exclude from Growth Chart --     Most recent vital signs: Vitals:   03/04/24 1813 03/04/24 2040  BP:  (!) 151/99  Pulse:  91  Resp:  16  Temp: 98.6 F (37 C)   SpO2:  100%    General: Awake, no distress.  CV:  Good peripheral perfusion.  Regular rate and rhythm Resp:  Normal effort.  Clear to auscultation Abd:  No distention.  Soft, no focal tenderness Other:  Moist oral mucosa   ED Results / Procedures / Treatments   Labs (all labs ordered are listed, but only abnormal results are displayed) Labs Reviewed  COMPREHENSIVE METABOLIC PANEL WITH GFR - Abnormal; Notable for the following components:      Result Value   Potassium 3.4 (*)    Creatinine, Ser 1.13 (*)    Total Protein 8.3 (*)    Total Bilirubin 1.4 (*)    GFR, Estimated 58 (*)    All other components within normal limits  CBC - Abnormal; Notable for the following components:   Hemoglobin 11.7 (*)    HCT 35.1 (*)  All other components within normal limits  URINALYSIS, ROUTINE W REFLEX MICROSCOPIC - Abnormal; Notable for the following components:   Color, Urine YELLOW (*)    APPearance HAZY (*)    Hgb urine dipstick SMALL (*)    Protein, ur 100 (*)    Nitrite POSITIVE (*)    Leukocytes,Ua TRACE (*)    Bacteria, UA FEW (*)    All other components within normal limits  URINE CULTURE  LIPASE, BLOOD     EKG    RADIOLOGY    PROCEDURES:  Procedures   MEDICATIONS ORDERED IN ED: Medications  ondansetron  (ZOFRAN -ODT) disintegrating tablet 4 mg (4 mg Oral Given 03/04/24 1814)  ondansetron  (ZOFRAN ) injection 4 mg (4 mg Intravenous Given 03/04/24 2016)  morphine (PF) 4 MG/ML injection 4 mg (4 mg Intravenous Given 03/04/24 2035)  pantoprazole (PROTONIX) injection 40 mg (40 mg Intravenous Given 03/04/24 2135)  sodium chloride  0.9 % bolus 500 mL (500 mLs Intravenous New Bag/Given 03/04/24 2133)     IMPRESSION / MDM / ASSESSMENT AND PLAN / ED COURSE  I reviewed the triage vital signs and the nursing  notes.  DDx: AKI, electrolyte derangement, dehydration, foodborne illness  Patient's presentation is most consistent with acute presentation with potential threat to life or bodily function.  Patient presents with crampy abdominal pain and vomiting after eating old sausage with dinner.  She is nontoxic, reassuring vital signs, feeling better after medications.  Will give IV fluids, p.o. trial.  Labs are reassuring.  About 2 weeks ago had CT abdomen pelvis as well as nuclear medicine renal function study.  I do not think repeat imaging tonight will be helpful.  Doubt bowel obstruction or perforation or urinary obstruction.  Doubt intra-abdominal abscess or diverticulitis or occult pancreatitis or biliary disease.   Clinical Course as of 03/04/24 2254  Fri Mar 04, 2024  2221 Urinalysis, Routine w reflex microscopic -Urine, Clean Catch(!) In setting of urostomy and current sx, not indicative of UTI. Will order follow up cx. [PS]    Clinical Course User Index [PS] Jacquie Maudlin, MD    ----------------------------------------- 10:54 PM on 03/04/2024 ----------------------------------------- Feeling much better.  Tolerating oral intake.  Stable for discharge and supportive care at home.   FINAL CLINICAL IMPRESSION(S) / ED DIAGNOSES   Final diagnoses:  Generalized abdominal pain  Nausea and vomiting, unspecified vomiting type     Rx / DC Orders   ED Discharge Orders          Ordered    ondansetron  (ZOFRAN -ODT) 4 MG disintegrating tablet  Every 8 hours PRN        03/04/24 2254             Note:  This document was prepared using Dragon voice recognition software and may include unintentional dictation errors.   Jacquie Maudlin, MD 03/04/24 [phone]

## 2024-03-04 NOTE — ED Notes (Signed)
 Pt tolerated po trial well

## 2024-03-04 NOTE — ED Notes (Addendum)
 Cassandra Russo

## 2024-03-04 NOTE — ED Notes (Signed)
Pt discharge information reviewed. Pt understands need for follow up care and when to return if symptoms worsen. All questions answered. Pt is alert and oriented with even and regular respirations. Pt is seen ambulating out of department with strong steady gait with family. ?

## 2024-03-06 LAB — URINE CULTURE

## 2024-04-04 ENCOUNTER — Emergency Department
Admission: EM | Admit: 2024-04-04 | Discharge: 2024-04-04 | Disposition: A | Discharge status: Home / Self Care | Attending: Emergency Medicine | Admitting: Emergency Medicine

## 2024-04-04 ENCOUNTER — Other Ambulatory Visit: Payer: Self-pay

## 2024-04-04 DIAGNOSIS — Z8541 Personal history of malignant neoplasm of cervix uteri: Secondary | ICD-10-CM | POA: Diagnosis not present

## 2024-04-04 DIAGNOSIS — I129 Hypertensive chronic kidney disease with stage 1 through stage 4 chronic kidney disease, or unspecified chronic kidney disease: Secondary | ICD-10-CM | POA: Insufficient documentation

## 2024-04-04 DIAGNOSIS — R1084 Generalized abdominal pain: Secondary | ICD-10-CM | POA: Insufficient documentation

## 2024-04-04 DIAGNOSIS — N189 Chronic kidney disease, unspecified: Secondary | ICD-10-CM | POA: Diagnosis not present

## 2024-04-04 LAB — CBC
HCT: 35.3 % — ABNORMAL LOW (ref 36.0–46.0)
Hemoglobin: 11.5 g/dL — ABNORMAL LOW (ref 12.0–15.0)
MCH: 29.6 pg (ref 26.0–34.0)
MCHC: 32.6 g/dL (ref 30.0–36.0)
MCV: 91 fL (ref 80.0–100.0)
Platelets: 281 10*3/uL (ref 150–400)
RBC: 3.88 MIL/uL (ref 3.87–5.11)
RDW: 13.2 % (ref 11.5–15.5)
WBC: 4.4 10*3/uL (ref 4.0–10.5)
nRBC: 0 % (ref 0.0–0.2)

## 2024-04-04 LAB — COMPREHENSIVE METABOLIC PANEL WITH GFR
ALT: 8 U/L (ref 0–44)
AST: 16 U/L (ref 15–41)
Albumin: 3.6 g/dL (ref 3.5–5.0)
Alkaline Phosphatase: 93 U/L (ref 38–126)
Anion gap: 9 (ref 5–15)
BUN: 18 mg/dL (ref 6–20)
CO2: 26 mmol/L (ref 22–32)
Calcium: 9 mg/dL (ref 8.9–10.3)
Chloride: 105 mmol/L (ref 98–111)
Creatinine, Ser: 1.04 mg/dL — ABNORMAL HIGH (ref 0.44–1.00)
GFR, Estimated: >60 mL/min (ref >60)
Glucose, Bld: 90 mg/dL (ref 70–99)
Potassium: 3.5 mmol/L (ref 3.5–5.1)
Sodium: 140 mmol/L (ref 135–145)
Total Bilirubin: 0.8 mg/dL (ref 0.0–1.2)
Total Protein: 7.8 g/dL (ref 6.5–8.1)

## 2024-04-04 LAB — LIPASE, BLOOD: Lipase: 24 U/L (ref 11–51)

## 2024-04-04 MED ORDER — SODIUM CHLORIDE 0.9 % IV BOLUS
30.0000 mL/kg | Freq: Once | INTRAVENOUS | Status: AC
  Administered 2024-04-04: 1000 mL via INTRAVENOUS

## 2024-04-04 MED ORDER — PANTOPRAZOLE SODIUM 40 MG IV SOLR
40.0000 mg | Freq: Once | INTRAVENOUS | Status: AC
  Administered 2024-04-04: 40 mg via INTRAVENOUS
  Filled 2024-04-04: qty 10

## 2024-04-04 MED ORDER — PANTOPRAZOLE SODIUM 40 MG PO TBEC
40.0000 mg | DELAYED_RELEASE_TABLET | Freq: Every day | ORAL | 0 refills | Status: AC
Start: 2024-04-04 — End: 2024-05-04

## 2024-04-04 MED ORDER — MORPHINE SULFATE (PF) 4 MG/ML IV SOLN
4.0000 mg | Freq: Once | INTRAVENOUS | Status: AC
  Administered 2024-04-04: 4 mg via INTRAVENOUS
  Filled 2024-04-04: qty 1

## 2024-04-04 MED ORDER — ONDANSETRON HCL 4 MG/2ML IJ SOLN
4.0000 mg | Freq: Once | INTRAMUSCULAR | Status: AC
  Administered 2024-04-04: 4 mg via INTRAVENOUS
  Filled 2024-04-04: qty 2

## 2024-04-04 MED ORDER — SODIUM CHLORIDE 0.9 % IV BOLUS
1000.0000 mL | Freq: Once | INTRAVENOUS | Status: AC
  Administered 2024-04-04: 1000 mL via INTRAVENOUS

## 2024-04-04 MED ORDER — ONDANSETRON 4 MG PO TBDP: 4.0000 mg | ORAL_TABLET | Freq: Three times a day (TID) | ORAL | 0 refills | Status: AC | PRN

## 2024-04-04 NOTE — ED Provider Notes (Signed)
 [facility] Provider Note    Event Date/Time   First MD Initiated Contact with Patient 04/04/24 1403     (approximate)   History   Abdominal Pain and Hypertension   HPI  Cassandra Russo is a 53 y.o. female   presents to the ED with complaint of elevated blood pressure this morning.  Patient states that she took her blood pressure several times and became anxious because her blood pressure was elevated.  She also developed some abdominal cramping with nausea and vomiting, which seem to make her blood pressure more elevated.  Patient denies any fever, chills, sick contacts.  Normal bowel movement yesterday and denies any urinary symptoms.  She had similar episode like this and was seen in the emergency department 1 month ago.  States the medicine that she was given at that time relieved her abdominal pain until this morning.  In looking through her record she also had pork sausage and then yesterday afternoon had barbecued ribs.  She is unaware of any prior problems with eating pork.  Patient has a history of hypertension, CKD, anemia, cervical cancer, sleep apnea.     Physical Exam   Triage Vital Signs: ED Triage Vitals  Encounter Vitals Group     BP 04/04/24 1312 (!) 214/107     Girls Systolic BP Percentile --      Girls Diastolic BP Percentile --      Boys Systolic BP Percentile --      Boys Diastolic BP Percentile --      Pulse Rate 04/04/24 1312 73     Resp 04/04/24 1312 18     Temp 04/04/24 1312 98 F (36.7 C)     Temp src --      SpO2 04/04/24 1312 100 %     Weight 04/04/24 1310 198 lb (89.8 kg)     Height 04/04/24 1310 5' 5 (1.651 m)     Head Circumference --      Peak Flow --      Pain Score 04/04/24 1310 10     Pain Loc --      Pain Education --      Exclude from Growth Chart --     Most recent vital signs: Vitals:   04/04/24 1312 04/04/24 1404  BP: (!) 214/107 (!) 172/85  Pulse: 73 70  Resp: 18 18  Temp: 98 F (36.7 C)   SpO2: 100%  99%     General: Awake, no distress.  Alert, talkative. CV:  Good peripheral perfusion.  Resp:  Normal effort.  Abd:  No distention.  Soft nontender.  Bowel sounds normoactive at present. Other:     ED Results / Procedures / Treatments   Labs (all labs ordered are listed, but only abnormal results are displayed) Labs Reviewed  COMPREHENSIVE METABOLIC PANEL WITH GFR - Abnormal; Notable for the following components:      Result Value   Creatinine, Ser 1.04 (*)    All other components within normal limits  CBC - Abnormal; Notable for the following components:   Hemoglobin 11.5 (*)    HCT 35.3 (*)    All other components within normal limits  LIPASE, BLOOD  URINALYSIS, ROUTINE W REFLEX MICROSCOPIC      PROCEDURES:  Critical Care performed:   Procedures   MEDICATIONS ORDERED IN ED: Medications  sodium chloride  0.9 % bolus 2,694 mL (0 mLs Intravenous Stopped 04/04/24 1515)  ondansetron  (ZOFRAN ) injection 4 mg (4 mg Intravenous Given  04/04/24 1458)  pantoprazole  (PROTONIX ) injection 40 mg (40 mg Intravenous Given 04/04/24 1458)  sodium chloride  0.9 % bolus 1,000 mL (1,000 mLs Intravenous New Bag/Given 04/04/24 1515)  morphine  (PF) 4 MG/ML injection 4 mg (4 mg Intravenous Given 04/04/24 1526)     IMPRESSION / MDM / ASSESSMENT AND PLAN / ED COURSE  I reviewed the triage vital signs and the nursing notes.   Differential diagnosis includes, but is not limited to, abdominal pain, viral illness, gastritis, esophagitis, GERD, intolerance to pork products possibly.  53 year old female presents to the ED with complaint of abdominal pain and also elevated blood pressure.  She states that she took her blood pressure 4-5 times prior to arrival in the ED.  In looking through her records she was seen last month for similar symptoms and states that both times she developed abdominal pain after eating pork products.  We discussed eliminating this from her diet and also following up with her  PCP.  She also is to call and make an appoint with Dr. Mamie Searles who is on-call for gastroenterology.  Patient was given Zofran  and Protonix  40 mg IV.  No continued vomiting while in the emergency department.  Blood pressure was decreasing with last blood pressure being 172/85 which is an improvement over triage reading.  Patient agrees with taking the medication and also following up with gastroenterology.      Patient's presentation is most consistent with acute complicated illness / injury requiring diagnostic workup.  FINAL CLINICAL IMPRESSION(S) / ED DIAGNOSES   Final diagnoses:  Generalized abdominal pain     Rx / DC Orders   ED Discharge Orders          Ordered    ondansetron  (ZOFRAN -ODT) 4 MG disintegrating tablet  Every 8 hours PRN        04/04/24 1553    pantoprazole  (PROTONIX ) 40 MG tablet  Daily        04/04/24 1553             Note:  This document was prepared using Dragon voice recognition software and may include unintentional dictation errors.   Stafford Eagles, PA-C 04/04/24 1603    Twilla Galea, MD 04/04/24 2232

## 2024-04-04 NOTE — ED Notes (Signed)
 See triage note  Presents with some abd cramping with couple of episodes of vomiting  States sxs' started this am   States this feels like food poisoning Afebrile on arrival

## 2024-04-04 NOTE — ED Triage Notes (Signed)
 Pt comes with HTN and pain in belly pain. Pt states the pain started today. Pt is on meds for BP.

## 2024-04-04 NOTE — Discharge Instructions (Addendum)
 Call make an appoint with Dr. Mamie Searles who is the stomach doctor that I spoke to you about.  A prescription for Protonix  was sent to the pharmacy for you to begin taking 1 daily.  Zofran  if needed for nausea and vomiting.  Increase fluids.  Avoid pork products at this time.

## 2024-04-20 NOTE — ED Triage Notes (Signed)
 Patient here s/t abdominal pain x4 days and constipated.  Last BM today.  Hx of cervical cancer.  Nausea and vomiting x2 today.  Denies fevers at home.  Pain 10/10 left flank pain.  Stent in left kidney placed 6/24.  [id number]  Past Medical History:  Diagnosis Date  . Activity    2 FOS  . Anxiety 07/01/2011   ativan prn  . Bilateral leg edema 09/30/2018   Managed by [facility] nephrology - Dr. Amarnath Kathresal.  Managed with lisinopril, hydrochlorothiazide 25 mg p.o. also ordered sleep study  . Breast pain, right 01/23/2014   01/23/2014 - Ordering diagnostic mammogram and US .   . Cancer (CMS/HHS-HCC) 07/2012   cervical cancer: radiation 5days/week, last tx 09/14/12 and chemo x 3 doses  . Cervical strain, acute 09/11/2015  . Chemotherapy-induced nausea and vomiting 09/19/2012  . Chest pain 10/26/2013  . Cough    productive: clear and moderated amts since chemo  . Dehydration fever    hospitalized 3 days for IV hydration. s/p chemo: vomiting and diarrhea  . Diarrhea 09/19/2012  . Encounter for long-term (current) use of medications 01/23/2014  . Encounter for long-term (current) use of medications 03/26/2018  . Encounter for long-term (current) use of other medications 03/31/2014  . History of angioedema 11/14/2019  . History of cervical cancer 08/18/2018  . History of chemotherapy   . History of radiation therapy   . Hypertension   . Sleep apnea    not using CPAP very often  . Wears dentures    top plate    Past Surgical History:  Procedure Laterality Date  . TUBAL LIGATION  2010  . COLONOSCOPY W/BIOPSY  05/22/2016   Procedure: COLONOSCOPY, FLEXIBLE; WITH BIOPSY, SINGLE OR MULTIPLE;  Surgeon: Asberry Jenkins Coffee, MD;  Location: [facility] END[facility];  Service: Gastroenterology;;  . EXPLORATORY LAPAROTOMY N/A 11/13/2022   Procedure: EXPLORATORY LAPAROTOMY, EXPLORATORY CELIOTOMY WITH OR WITHOUT BIOPSY(S) (SEPARATE PROCEDURE);  Surgeon: Mancil Barter, MD;  Location: [facility] OR;  Service:  Gynecology;  Laterality: N/A;  . PELVIC EXENTERATION W/HYSTERECTOMY TOTAL ABDOMINAL Bilateral 11/13/2022   Procedure: PELVIC EXENTERATION FOR GYNECOLOGIC MALIGNANCY, W/ TOTAL ABDOMINAL HYSTERECTOMY/CERVICECTOMY,WITH/WITHOUT REMOVAL TUBE(S),OVARY(S),REMOVAL BLADDER, URETERAL TRANSPLANTATIONS, ABDOMINOPERINEAL RESECT;  Surgeon: Mancil Barter, MD;  Location: [facility] OR;  Service: Gynecology;  Laterality: Bilateral;  . BIOPSY OR EXCISION OF LYMPH NODE OPEN SUPERFICIAL GROIN Left 11/13/2022   Procedure: BIOPSY OR EXCISION OF LYMPH NODE(S); OPEN, SUPERFICIAL GROIN;  Surgeon: Mancil Barter, MD;  Location: [facility] OR;  Service: Gynecology;  Laterality: Left;  SABRA VAGINECTOMY N/A 11/13/2022   Procedure: VAGINECTOMY, PARTIAL REMOVAL OF VAGINAL WALL; WITH REMOVAL OF PARAVAGINAL TISSUE (RADICAL VAGINECTOMY);  Surgeon: Mancil Barter, MD;  Location: [facility];  Service: Gynecology;  Laterality: N/A;  . PELVIC EXAMINATION UNDER ANESTHESIA N/A 11/13/2022   Procedure: PELVIC EXAMINATION UNDER ANESTHESIA (OTHER THAN LOCAL);  Surgeon: Mancil Barter, MD;  Location: [facility] OR;  Service: Gynecology;  Laterality: N/A;  . PELVIC EXENTERATION FOR PROSTATIC/URETHRAL MALIGNANCY N/A 11/13/2022   Procedure: **Possible** - PELVIC EXENTERATION, COMPLETE, WITH REMOVAL OF BLADDER AND URETERAL TRANSPLANTATIONS, WITH/WITHOUT HYSTERECTOMY AND/OR ABDOMINOPERINEAL RESECTION RECTUM, COLON AND COLOSTOMY, ANY COMBINATION THEREOF;  Surgeon: Mancil Barter, MD;  Location: [facility] OR;  Service: Gynecology;  Laterality: N/A;  . CYSTECTOMY PARTIAL N/A 11/13/2022   Procedure: CYSTECTOMY, PARTIAL; COMPLICATED (EG, POSTRADIATION, PREVIOUS SURGERY, DIFFICULT LOCATION);  Surgeon: Andra Barter Dunnings, MD;  Location: [facility] OR;  Service: Urology;  Laterality: N/A;  . CREATION ILEAL CONDUIT BRICKER OPERATION N/A 11/13/2022  Procedure: URETEROILEAL CONDUIT (ILEAL BLADDER), INCLUDING INTESTINE ANASTOMOSIS(BRICKER OPERATION);   Surgeon: Andra Prentice Dunnings, MD;  Location: [facility] OR;  Service: Urology;  Laterality: N/A;  . FLAP MYOCUTANEOUS/FASCIOCUTANEOUS ABDOMEN Bilateral 11/13/2022   Procedure: MUSCLE, MYOCUTANEOUS, OR FASCIOCUTANEOUS FLAP; ABDOMEN;  Surgeon: Georgie Cella, MD;  Location: [facility] OR;  Service: Plastic Surgery;  Laterality: Bilateral;  . CLOSURE INTESTINAL CUTANEOUS FISTULA N/A 04/13/2023   Procedure: CLOSURE OF INTESTINAL CUTANEOUS FISTULA, COMPLEX ABDOMINAL CLOSURE;  Surgeon: Duayne Ricardo SAILOR, MD;  Location: [facility] OR;  Service: General Surgery;  Laterality: N/A;  . PELVIC EXAMINATION UNDER ANESTHESIA N/A 04/13/2023   Procedure: PELVIC EXAMINATION UNDER ANESTHESIA (OTHER THAN LOCAL);  Surgeon: Ascencion Darryle Pyo, MD;  Location: [facility] OR;  Service: Gynecology;  Laterality: N/A;    Allergies  Allergen Reactions  . Irbesartan Angioedema  . Amlodipine Swelling  . Vancomycin  Itching    Developed itching during vanc infusion 9/29. Improved with benadryl and infusion able to be completed at slower rate without dyspnea or swelling.

## 2024-04-21 NOTE — ED Notes (Signed)
 Pt arrived in ED c/o L flank pain ongoing for 4 days. Pain is sharp and intermittent, 7/10. Reports a small BM yesterday. Denies CP, SOB, N/V. On cont cardiac and o2 monitoring. Call bell within reach, denies any further needs at this time.

## 2024-04-21 NOTE — ED Notes (Signed)
 Pt endorsing 10/10 L flank/side pain, pt appears anxious and frustrated. NA provided therapeutic communication to de-escalate and calm pt, pt verbalized understanding and is now calm and cooperative. NA provided heat pack and pt verbalized relief. RN notified. Bed is locked and in lowest position, call bell is within reach. No further needs expressed at this time. Family at bedside. Care ongoing.

## 2024-04-21 NOTE — ED Notes (Signed)
 Pt ambulated independently off unit with steady gait. Discharge, follow-up, and medication instructions reviewed, pt verbalized understanding. VSS at time of discharge. No other questions for care team at this time.

## 2024-04-25 ENCOUNTER — Encounter: Payer: Self-pay | Admitting: Emergency Medicine

## 2024-04-25 ENCOUNTER — Other Ambulatory Visit: Payer: Self-pay

## 2024-04-25 ENCOUNTER — Emergency Department

## 2024-04-25 ENCOUNTER — Emergency Department
Admission: EM | Admit: 2024-04-25 | Discharge: 2024-04-25 | Disposition: A | Discharge status: Home / Self Care | Attending: Emergency Medicine | Admitting: Emergency Medicine

## 2024-04-25 DIAGNOSIS — Z8541 Personal history of malignant neoplasm of cervix uteri: Secondary | ICD-10-CM | POA: Insufficient documentation

## 2024-04-25 DIAGNOSIS — E876 Hypokalemia: Secondary | ICD-10-CM | POA: Diagnosis not present

## 2024-04-25 DIAGNOSIS — R1032 Left lower quadrant pain: Secondary | ICD-10-CM | POA: Diagnosis present

## 2024-04-25 LAB — URINALYSIS, ROUTINE W REFLEX MICROSCOPIC
Bilirubin Urine: NEGATIVE
Glucose, UA: NEGATIVE mg/dL
Ketones, ur: NEGATIVE mg/dL
Nitrite: POSITIVE — AB
Protein, ur: 100 mg/dL — AB
Specific Gravity, Urine: 1.012 (ref 1.005–1.030)
pH: 8 (ref 5.0–8.0)

## 2024-04-25 LAB — COMPREHENSIVE METABOLIC PANEL WITH GFR
ALT: 8 U/L (ref 0–44)
AST: 15 U/L (ref 15–41)
Albumin: 3.7 g/dL (ref 3.5–5.0)
Alkaline Phosphatase: 93 U/L (ref 38–126)
Anion gap: 10 (ref 5–15)
BUN: 22 mg/dL — ABNORMAL HIGH (ref 6–20)
CO2: 26 mmol/L (ref 22–32)
Calcium: 9.3 mg/dL (ref 8.9–10.3)
Chloride: 104 mmol/L (ref 98–111)
Creatinine, Ser: 0.94 mg/dL (ref 0.44–1.00)
GFR, Estimated: >60 mL/min (ref >60)
Glucose, Bld: 99 mg/dL (ref 70–99)
Potassium: 3.4 mmol/L — ABNORMAL LOW (ref 3.5–5.1)
Sodium: 140 mmol/L (ref 135–145)
Total Bilirubin: 0.8 mg/dL (ref 0.0–1.2)
Total Protein: 8.1 g/dL (ref 6.5–8.1)

## 2024-04-25 LAB — CBC
HCT: 33.6 % — ABNORMAL LOW (ref 36.0–46.0)
Hemoglobin: 11.2 g/dL — ABNORMAL LOW (ref 12.0–15.0)
MCH: 30.3 pg (ref 26.0–34.0)
MCHC: 33.3 g/dL (ref 30.0–36.0)
MCV: 90.8 fL (ref 80.0–100.0)
Platelets: 309 K/uL (ref 150–400)
RBC: 3.7 MIL/uL — ABNORMAL LOW (ref 3.87–5.11)
RDW: 12.6 % (ref 11.5–15.5)
WBC: 4.9 K/uL (ref 4.0–10.5)
nRBC: 0 % (ref 0.0–0.2)

## 2024-04-25 LAB — LIPASE, BLOOD: Lipase: 36 U/L (ref 11–51)

## 2024-04-25 MED ORDER — LIDOCAINE 5 % EX PTCH
1.0000 | MEDICATED_PATCH | CUTANEOUS | Status: DC
  Administered 2024-04-25: 1 via TRANSDERMAL
  Filled 2024-04-25: qty 1

## 2024-04-25 MED ORDER — OXYCODONE HCL 5 MG PO TABS: 5.0000 mg | ORAL_TABLET | Freq: Three times a day (TID) | ORAL | 0 refills | Status: AC | PRN

## 2024-04-25 MED ORDER — OXYCODONE HCL 5 MG PO TABS
5.0000 mg | ORAL_TABLET | Freq: Once | ORAL | Status: AC
  Administered 2024-04-25: 5 mg via ORAL
  Filled 2024-04-25: qty 1

## 2024-04-25 MED ORDER — ACETAMINOPHEN 500 MG PO TABS
1000.0000 mg | ORAL_TABLET | Freq: Once | ORAL | Status: AC
  Administered 2024-04-25: 1000 mg via ORAL
  Filled 2024-04-25: qty 2

## 2024-04-25 NOTE — ED Provider Notes (Signed)
 [facility] Provider Note    Event Date/Time   First MD Initiated Contact with Patient 04/25/24 1139     (approximate)   History   Abdominal Pain   HPI  Cassandra Russo is a 53 y.o. female who presents to the ED for evaluation of Abdominal Pain   I review a [facility] ED visit from 5 days ago where she was seen for left-sided abdominal pain.  CT without acute features.  Left nephroureteral stent and ileal conduit.  Chronic back pain, cervical cancer.  Patient presents to the ED with chronic left-sided abdominal pain.  She reports at least 5 weeks of LLQ and left flank abdominal pain.  This was what she was seen in the ED for at [facility] 5 days ago.  She reports being told that it was just the muscles after a benign workup.  She reports frustration with this and presents to the ED requesting evaluation of this pain.  During my initial evaluation she reports that she does not want pain meds she is has to find out what is wrong with me.   Physical Exam   Triage Vital Signs: ED Triage Vitals [04/25/24 0956]  Encounter Vitals Group     BP (!) 164/106     Girls Systolic BP Percentile      Girls Diastolic BP Percentile      Boys Systolic BP Percentile      Boys Diastolic BP Percentile      Pulse Rate 69     Resp 17     Temp 99.2 F (37.3 C)     Temp Source Oral     SpO2 98 %     Weight 192 lb (87.1 kg)     Height 5' 3 (1.6 m)     Head Circumference      Peak Flow      Pain Score 9     Pain Loc      Pain Education      Exclude from Growth Chart     Most recent vital signs: Vitals:   04/25/24 1200 04/25/24 1230  BP: (!) 143/92 (!) 141/86  Pulse: (!) 58 61  Resp:  16  Temp:    SpO2: 100% 100%    General: Awake, no distress.  CV:  Good peripheral perfusion.  Resp:  Normal effort.  Abd:  No distention.  Soft and nontender throughout.  No skin changes or signs of trauma or swelling or rash MSK:  No deformity noted.  Neuro:  No focal deficits  appreciated. Other:     ED Results / Procedures / Treatments   Labs (all labs ordered are listed, but only abnormal results are displayed) Labs Reviewed  COMPREHENSIVE METABOLIC PANEL WITH GFR - Abnormal; Notable for the following components:      Result Value   Potassium 3.4 (*)    BUN 22 (*)    All other components within normal limits  CBC - Abnormal; Notable for the following components:   RBC 3.70 (*)    Hemoglobin 11.2 (*)    HCT 33.6 (*)    All other components within normal limits  URINALYSIS, ROUTINE W REFLEX MICROSCOPIC - Abnormal; Notable for the following components:   Color, Urine YELLOW (*)    APPearance HAZY (*)    Hgb urine dipstick SMALL (*)    Protein, ur 100 (*)    Nitrite POSITIVE (*)    Leukocytes,Ua MODERATE (*)    Bacteria, UA RARE (*)  All other components within normal limits  URINE CULTURE  LIPASE, BLOOD    EKG   RADIOLOGY CT abdomen/pelvis interpreted by me without signs of acute pathology.  Official radiology report(s): CT ABDOMEN PELVIS WO CONTRAST Result Date: 04/25/2024 CLINICAL DATA:  Left lower quadrant and left flank pain. History of ileal conduit and reversed colostomy. History of left ureteral stent. EXAM: CT ABDOMEN AND PELVIS WITHOUT CONTRAST TECHNIQUE: Multidetector CT imaging of the abdomen and pelvis was performed following the standard protocol without IV contrast. RADIATION DOSE REDUCTION: This exam was performed according to the departmental dose-optimization program which includes automated exposure control, adjustment of the mA and/or kV according to patient size and/or use of iterative reconstruction technique. COMPARISON:  02/20/2023 FINDINGS: Lower chest: Mild scarring or atelectasis in both lower lobes. Hepatobiliary: 9 by 5 mm fluid density lesion in the right hepatic lobe on image 17 series 2, probably a small cyst. Pancreas: Unremarkable Spleen: Unremarkable Adrenals/Urinary Tract: Both adrenal glands appear normal. Left  double-J ureteral stent noted proximal loop formed in the renal collecting system, and the stent extending out through the urostomy ileal conduit and into the presumed urostomy bag. There is gas in both renal collecting systems. This is new compared to the 02/20/2023 exam. No hydronephrosis or hydroureter. Hyperdense 9 mm lesion in the right kidney lower pole, internal density 126 Hounsfield units, compatible with benign Bosniak category 2 cyst. No further imaging workup of this lesion is indicated. Urinary bladder surgically absent. Stomach/Bowel: Dilute contrast medium in the distal colon. No dilated bowel. Vascular/Lymphatic: Mild aortoiliac atheromatous vascular calcification. Reproductive: Absent uterus, adnexa unremarkable. Other: Chronic presacral edema. Musculoskeletal: Moderate degenerative arthropathy of both hips. Fused facet joints bilaterally at L4-5 and on the right at L5-S1. IMPRESSION: 1. Left double-J ureteral stent noted proximal loop formed in the renal collecting system, and the stent extending out through the urostomy ileal conduit and into the presumed urostomy bag. No hydronephrosis or hydroureter. 2. There is gas in both renal collecting systems. This is new compared to the 02/20/2023 exam. Although pyelitis is not excluded, there is no overt stranding along the renal pelvis region, and this may simply represent backflow of gas from the urostomy bag into the collecting systems. Correlate with signs of infection. 3. Moderate degenerative arthropathy of both hips. 4. Fused facet joints bilaterally at L4-5 and on the right at L5-S1. 5. Mild aortoiliac atheromatous vascular calcification. 6. Chronic presacral edema. 7.  Aortic Atherosclerosis (ICD10-I70.0). Electronically Signed   By: Ryan Salvage M.D.   On: 04/25/2024 13:28    PROCEDURES and INTERVENTIONS:  Procedures  Medications  lidocaine  (LIDODERM ) 5 % 1 patch (1 patch Transdermal Patch Applied 04/25/24 1218)  acetaminophen   (TYLENOL ) tablet 1,000 mg (1,000 mg Oral Given 04/25/24 1218)  oxyCODONE  (Oxy IR/ROXICODONE ) immediate release tablet 5 mg (5 mg Oral Given 04/25/24 1218)     IMPRESSION / MDM / ASSESSMENT AND PLAN / ED COURSE  I reviewed the triage vital signs and the nursing notes.  Differential diagnosis includes, but is not limited to, ureteral colic or stone, stent malfunction, sepsis, SBO, chronic pain syndrome, drug-seeking, pyelonephritis  {Patient presents with symptoms of an acute illness or injury that is potentially life-threatening.  Patient presents with chronic abdominal pain.  Look systemically well with a benign exam.  Reassuring vital signs and blood work with a normal WBC, lipase and metabolic panel.  Urine with nitrites and leukocytes but with her ileal conduit can be difficult to assess infection.  Her urine  often looks like this when I go back and look at previous urinalyses as recently as a couple weeks ago.  She has no emesis, fevers or reports of the change in quality of the output of her urostomy, I send her urine for culture but abstain from antibiotics.  Furthermore considering the chronicity of her pain as being at least 5 weeks seems less likely to be infection.  Her pain is controlled and she is suitable for outpatient management.  We discussed close return precautions.  Clinical Course as of 04/25/24 1445  Mon Apr 25, 2024  1438 Reassessed.  Reports feeling better.  We discussed CT results.  We discussed urinalysis results and uncertain signs of urinary tract infectious disease.  After discussing risks and benefits [DS]    Clinical Course User Index [DS] Claudene Rover, MD     FINAL CLINICAL IMPRESSION(S) / ED DIAGNOSES   Final diagnoses:  Left lower quadrant abdominal pain     Rx / DC Orders   ED Discharge Orders          Ordered    oxyCODONE  (ROXICODONE ) 5 MG immediate release tablet  Every 8 hours PRN        04/25/24 1444             Note:  This document was  prepared using Dragon voice recognition software and may include unintentional dictation errors.   Claudene Rover, MD 04/25/24 [phone]

## 2024-04-25 NOTE — ED Notes (Signed)
 First Nurse Note: Pt to ED via [facility] from home for abdominal pain. Pt has hx/o cervical cancer. Pt has colostomy bag. Pt seen at [facility] Wednesday for abdominal pain and was told that it is muscular. Pt has not eaten or take her meds since yesterday.  69- HR 100%- SpO2 176/81- BP

## 2024-04-25 NOTE — Discharge Instructions (Signed)
 Use Tylenol  for pain and fevers.  Up to 1000 mg per dose, up to 4 times per day.  Do not take more than 4000 mg of Tylenol /acetaminophen  within 24 hours..  Oxycodone  as needed for more severe/breakthrough pain

## 2024-04-25 NOTE — ED Triage Notes (Signed)
 Patient to ED via [facility] from home for left sided abd pain. States has been ongoing for a while but started up again last night. Pt states last BM a couple days ago. States she use to have a colostomy but was reversed 1 year ago. Currently has urine bag on right side of abd. Hx of cervical cancer

## 2024-04-26 LAB — URINE CULTURE

## 2024-10-10 ENCOUNTER — Inpatient Hospital Stay

## 2024-10-10 ENCOUNTER — Emergency Department

## 2024-10-10 ENCOUNTER — Inpatient Hospital Stay
Admission: EM | Admit: 2024-10-10 | Discharge: 2024-10-11 | DRG: 390 | Discharge status: Against Medical Advice | Attending: Internal Medicine | Admitting: Internal Medicine

## 2024-10-10 ENCOUNTER — Other Ambulatory Visit: Payer: Self-pay

## 2024-10-10 DIAGNOSIS — K56609 Unspecified intestinal obstruction, unspecified as to partial versus complete obstruction: Secondary | ICD-10-CM | POA: Diagnosis present

## 2024-10-10 DIAGNOSIS — N182 Chronic kidney disease, stage 2 (mild): Secondary | ICD-10-CM | POA: Diagnosis present

## 2024-10-10 DIAGNOSIS — K56601 Complete intestinal obstruction, unspecified as to cause: Secondary | ICD-10-CM | POA: Diagnosis present

## 2024-10-10 DIAGNOSIS — Z5329 Procedure and treatment not carried out because of patient's decision for other reasons: Secondary | ICD-10-CM | POA: Diagnosis present

## 2024-10-10 DIAGNOSIS — I10 Essential (primary) hypertension: Secondary | ICD-10-CM | POA: Diagnosis present

## 2024-10-10 DIAGNOSIS — Z8541 Personal history of malignant neoplasm of cervix uteri: Secondary | ICD-10-CM

## 2024-10-10 DIAGNOSIS — Z9221 Personal history of antineoplastic chemotherapy: Secondary | ICD-10-CM | POA: Diagnosis not present

## 2024-10-10 DIAGNOSIS — Z79899 Other long term (current) drug therapy: Secondary | ICD-10-CM | POA: Diagnosis not present

## 2024-10-10 DIAGNOSIS — I129 Hypertensive chronic kidney disease with stage 1 through stage 4 chronic kidney disease, or unspecified chronic kidney disease: Secondary | ICD-10-CM | POA: Diagnosis present

## 2024-10-10 DIAGNOSIS — G4733 Obstructive sleep apnea (adult) (pediatric): Secondary | ICD-10-CM | POA: Diagnosis present

## 2024-10-10 DIAGNOSIS — Z888 Allergy status to other drugs, medicaments and biological substances status: Secondary | ICD-10-CM | POA: Diagnosis not present

## 2024-10-10 DIAGNOSIS — Z923 Personal history of irradiation: Secondary | ICD-10-CM

## 2024-10-10 LAB — COMPREHENSIVE METABOLIC PANEL WITH GFR
ALT: <5 U/L (ref 0–44)
AST: 20 U/L (ref 15–41)
Albumin: 4.2 g/dL (ref 3.5–5.0)
Alkaline Phosphatase: 119 U/L (ref 38–126)
Anion gap: 14 (ref 5–15)
BUN: 16 mg/dL (ref 6–20)
CO2: 24 mmol/L (ref 22–32)
Calcium: 9.7 mg/dL (ref 8.9–10.3)
Chloride: 101 mmol/L (ref 98–111)
Creatinine, Ser: 1.05 mg/dL — ABNORMAL HIGH (ref 0.44–1.00)
GFR, Estimated: >60 mL/min
Glucose, Bld: 138 mg/dL — ABNORMAL HIGH (ref 70–99)
Potassium: 3.8 mmol/L (ref 3.5–5.1)
Sodium: 139 mmol/L (ref 135–145)
Total Bilirubin: 0.7 mg/dL (ref 0.0–1.2)
Total Protein: 8.3 g/dL — ABNORMAL HIGH (ref 6.5–8.1)

## 2024-10-10 LAB — CBC WITH DIFFERENTIAL/PLATELET
Abs Immature Granulocytes: 0.03 K/uL (ref 0.00–0.07)
Basophils Absolute: 0 K/uL (ref 0.0–0.1)
Basophils Relative: 1 %
Eosinophils Absolute: 0.1 K/uL (ref 0.0–0.5)
Eosinophils Relative: 2 %
HCT: 35.8 % — ABNORMAL LOW (ref 36.0–46.0)
Hemoglobin: 12 g/dL (ref 12.0–15.0)
Immature Granulocytes: 0 %
Lymphocytes Relative: 30 %
Lymphs Abs: 2.4 K/uL (ref 0.7–4.0)
MCH: 30.2 pg (ref 26.0–34.0)
MCHC: 33.5 g/dL (ref 30.0–36.0)
MCV: 90.2 fL (ref 80.0–100.0)
Monocytes Absolute: 0.3 K/uL (ref 0.1–1.0)
Monocytes Relative: 4 %
Neutro Abs: 5.2 K/uL (ref 1.7–7.7)
Neutrophils Relative %: 63 %
Platelets: 313 K/uL (ref 150–400)
RBC: 3.97 MIL/uL (ref 3.87–5.11)
RDW: 12.5 % (ref 11.5–15.5)
WBC: 8.1 K/uL (ref 4.0–10.5)
nRBC: 0 % (ref 0.0–0.2)

## 2024-10-10 LAB — LIPASE, BLOOD: Lipase: 26 U/L (ref 11–51)

## 2024-10-10 MED ORDER — MORPHINE SULFATE (PF) 4 MG/ML IV SOLN
4.0000 mg | Freq: Once | INTRAVENOUS | Status: AC
  Administered 2024-10-10: 4 mg via INTRAVENOUS
  Filled 2024-10-10: qty 1

## 2024-10-10 MED ORDER — IOHEXOL 300 MG/ML  SOLN
100.0000 mL | Freq: Once | INTRAMUSCULAR | Status: AC | PRN
  Administered 2024-10-10: 100 mL via INTRAVENOUS

## 2024-10-10 MED ORDER — HYDROMORPHONE HCL 1 MG/ML IJ SOLN
1.0000 mg | Freq: Once | INTRAMUSCULAR | Status: AC
  Administered 2024-10-10: 1 mg via INTRAVENOUS
  Filled 2024-10-10: qty 1

## 2024-10-10 MED ORDER — HYDRALAZINE HCL 20 MG/ML IJ SOLN: 10.0000 mg | Freq: Four times a day (QID) | INTRAMUSCULAR | Status: DC | PRN

## 2024-10-10 MED ORDER — ENOXAPARIN SODIUM 60 MG/0.6ML IJ SOSY
0.5000 mg/kg | PREFILLED_SYRINGE | INTRAMUSCULAR | Status: DC
  Administered 2024-10-10: 45 mg via SUBCUTANEOUS
  Filled 2024-10-10: qty 0.6

## 2024-10-10 MED ORDER — LACTATED RINGERS IV SOLN: INTRAVENOUS | Status: DC

## 2024-10-10 MED ORDER — PANTOPRAZOLE SODIUM 40 MG PO TBEC
40.0000 mg | DELAYED_RELEASE_TABLET | Freq: Every day | ORAL | Status: DC
  Administered 2024-10-10: 40 mg via ORAL
  Filled 2024-10-10: qty 1

## 2024-10-10 MED ORDER — METOPROLOL SUCCINATE ER 50 MG PO TB24
100.0000 mg | ORAL_TABLET | Freq: Every day | ORAL | Status: DC
  Administered 2024-10-10: 100 mg via ORAL
  Filled 2024-10-10: qty 2

## 2024-10-10 MED ORDER — SODIUM CHLORIDE 0.9 % IV SOLN: INTRAVENOUS | Status: DC

## 2024-10-10 MED ORDER — MORPHINE SULFATE (PF) 2 MG/ML IV SOLN
2.0000 mg | INTRAVENOUS | Status: DC | PRN
  Filled 2024-10-10: qty 1

## 2024-10-10 MED ORDER — LACTATED RINGERS IV BOLUS
1000.0000 mL | Freq: Once | INTRAVENOUS | Status: AC
  Administered 2024-10-10: 1000 mL via INTRAVENOUS

## 2024-10-10 MED ORDER — DIATRIZOATE MEGLUMINE & SODIUM 66-10 % PO SOLN
90.0000 mL | Freq: Once | ORAL | Status: AC
  Administered 2024-10-10: 90 mL via ORAL

## 2024-10-10 MED ORDER — HYDROMORPHONE HCL 1 MG/ML IJ SOLN
0.5000 mg | INTRAMUSCULAR | Status: DC | PRN
  Administered 2024-10-10: 0.5 mg via INTRAVENOUS
  Filled 2024-10-10: qty 0.5

## 2024-10-10 MED ORDER — PROCHLORPERAZINE EDISYLATE 10 MG/2ML IJ SOLN: 5.0000 mg | Freq: Four times a day (QID) | INTRAMUSCULAR | Status: DC | PRN

## 2024-10-10 MED ORDER — HYDRALAZINE HCL 20 MG/ML IJ SOLN: 5.0000 mg | Freq: Four times a day (QID) | INTRAMUSCULAR | Status: DC | PRN

## 2024-10-10 MED ORDER — DIATRIZOATE MEGLUMINE & SODIUM 66-10 % PO SOLN: 90.0000 mL | Freq: Once | ORAL | Status: DC

## 2024-10-10 MED ORDER — ONDANSETRON HCL 4 MG/2ML IJ SOLN
4.0000 mg | Freq: Once | INTRAMUSCULAR | Status: AC
  Administered 2024-10-10: 4 mg via INTRAVENOUS
  Filled 2024-10-10: qty 2

## 2024-10-10 NOTE — TOC CM/SW Note (Signed)
 Transition of Care [facility]) - Inpatient Brief Assessment   Patient Details  Name: Cassandra Russo MRN: [id number] Date of Birth: 07/25/1971  Transition of Care [facility]) CM/SW Contact:    Daved JONETTA Hamilton, RN Phone Number: 10/10/2024, 11:46 AM   Clinical Narrative:   Transition of Care [facility]) Screening Note   Patient Details  Name: Cassandra Russo Date of Birth: 10-16-1971   Transition of Care [facility]) CM/SW Contact:    Daved JONETTA Hamilton, RN Phone Number: 10/10/2024, 11:46 AM    Transition of Care Department [facility]) has reviewed patient and no TOC needs have been identified at this time. If new patient transition needs arise, please place a TOC consult.    Transition of Care Asessment: Insurance and Status: Insurance coverage has been reviewed Patient has primary care physician: Yes   Prior level of function:: Independent Prior/Current Home Services: No current home services Social Drivers of Health Review: SDOH reviewed no interventions necessary Readmission risk has been reviewed: Yes Transition of care needs: no transition of care needs at this time

## 2024-10-10 NOTE — Progress Notes (Signed)
 Patient ordered on CPAP at night. Patient stated has home machine and sometimes wears it but declines to use one of ours.

## 2024-10-10 NOTE — ED Provider Notes (Signed)
 "  [facility] Provider Note    Event Date/Time   First MD Initiated Contact with Patient 10/10/24 0310     (approximate)   History   Chief Complaint Abdominal Pain   HPI  Cassandra Russo is a 53 y.o. female with past medical history of hypertension, CKD, and cervical cancer who presents to the ED complaining of abdominal pain.  Patient reports that she initially developed mild abdominal pain 3 days ago that seem to become acutely worse earlier this evening.  [place] reports that she has not had a bowel movement in 3 days, but patient does states she continues to pass gas.  She has had increasing nausea and vomiting over the past 12 hours, has been unable to keep anything down during this time.  She reports concern for recurrent bowel obstruction.     Physical Exam   Triage Vital Signs: ED Triage Vitals  Encounter Vitals Group     BP 10/10/24 0313 (!) 193/116     Girls Systolic BP Percentile --      Girls Diastolic BP Percentile --      Boys Systolic BP Percentile --      Boys Diastolic BP Percentile --      Pulse Rate 10/10/24 0313 85     Resp 10/10/24 0313 20     Temp 10/10/24 0313 97.7 F (36.5 C)     Temp Source 10/10/24 0313 Oral     SpO2 10/10/24 0313 100 %     Weight --      Height --      Head Circumference --      Peak Flow --      Pain Score 10/10/24 0311 10     Pain Loc --      Pain Education --      Exclude from Growth Chart --     Most recent vital signs: Vitals:   10/10/24 0313 10/10/24 0355  BP: (!) 193/116 (!) 195/100  Pulse: 85 84  Resp: 20 18  Temp: 97.7 F (36.5 C) (!) 97 F (36.1 C)  SpO2: 100% 100%    Constitutional: Alert and oriented. Eyes: Conjunctivae are normal. Head: Atraumatic. Nose: No congestion/rhinnorhea. Mouth/Throat: Mucous membranes are moist.  Cardiovascular: Normal rate, regular rhythm. Grossly normal heart sounds.  2+ radial pulses bilaterally. Respiratory: Normal respiratory effort.  No  retractions. Lungs CTAB. Gastrointestinal: Soft and diffusely tender to palpation with mild distention. Musculoskeletal: No lower extremity tenderness nor edema.  Neurologic:  Normal speech and language. No gross focal neurologic deficits are appreciated.    ED Results / Procedures / Treatments   Labs (all labs ordered are listed, but only abnormal results are displayed) Labs Reviewed  CBC WITH DIFFERENTIAL/PLATELET - Abnormal; Notable for the following components:      Result Value   HCT 35.8 (*)    All other components within normal limits  COMPREHENSIVE METABOLIC PANEL WITH GFR - Abnormal; Notable for the following components:   Glucose, Bld 138 (*)    Creatinine, Ser 1.05 (*)    Total Protein 8.3 (*)    All other components within normal limits  LIPASE, BLOOD     RADIOLOGY CT abdomen/pelvis reviewed and interpreted by me with dilated bowel loops concerning for obstruction.  PROCEDURES:  Critical Care performed: No  Procedures   MEDICATIONS ORDERED IN ED: Medications  diatrizoate  meglumine -sodium (GASTROGRAFIN ) 66-10 % solution 90 mL (has no administration in time range)  morphine  (PF) 4 MG/ML injection 4  mg (4 mg Intravenous Given 10/10/24 0350)  ondansetron  (ZOFRAN ) injection 4 mg (4 mg Intravenous Given 10/10/24 0348)  lactated ringers  bolus 1,000 mL (1,000 mLs Intravenous New Bag/Given 10/10/24 0353)  iohexol  (OMNIPAQUE ) 300 MG/ML solution 100 mL (100 mLs Intravenous Contrast Given 10/10/24 0432)  HYDROmorphone  (DILAUDID ) injection 1 mg (1 mg Intravenous Given 10/10/24 0529)     IMPRESSION / MDM / ASSESSMENT AND PLAN / ED COURSE  I reviewed the triage vital signs and the nursing notes.                              53 y.o. female with past medical history of hypertension, CKD, and cervical cancer who presents to the ED complaining of 3 days of abdominal pain, acutely worse this evening and associated with nausea, vomiting, and constipation.  Patient's  presentation is most consistent with acute presentation with potential threat to life or bodily function.  Differential diagnosis includes, but is not limited to, bowel obstruction, ileus, appendicitis, diverticulitis, UTI, kidney stone, pancreatitis, hepatitis, dehydration, electrolyte abnormality, AKI.  Patient uncomfortable appearing, actively dry heaving at the time of my assessment.  Vital signs remarkable for hypertension but otherwise reassuring.  Her abdomen is soft but diffusely tender to palpation and mildly distended, we will further assess with CT imaging.  Lab results are pending at this time, will hydrate with IV fluids and treat symptomatically with IV morphine  and Zofran .  Labs without significant anemia, leukocytosis, electrolyte abnormality, or AKI.  LFTs and lipase are unremarkable.  CT imaging concerning for bowel obstruction, likely due to adhesions per radiology.  No apparent issues noted with her ileal conduit diversion.  Case discussed with Dr. Tye of general surgery, who will evaluate the patient but recommends admission to the hospitalist service here at [facility], no plans for surgery at this time.  Case discussed with hospitalist for admission.      FINAL CLINICAL IMPRESSION(S) / ED DIAGNOSES   Final diagnoses:  Complete intestinal obstruction, unspecified cause (HCC)     Rx / DC Orders   ED Discharge Orders     None        Note:  This document was prepared using Dragon voice recognition software and may include unintentional dictation errors.   Willo Dunnings, MD 10/10/24 [phone]  "

## 2024-10-10 NOTE — H&P (Signed)
 "  History and Physical    [name] FMW:[id number] DOB: 1971/09/10 DOA: 10/10/2024  DOS: the patient was seen and examined on 10/10/2024  PCP: Marcelino Squires, MD   Patient coming from: Home  I have personally briefly reviewed patient's old medical records in [facility]  Chief Complaint: Abdominal pain for 3 days  HPI: Cassandra Russo is a pleasant 53 y.o. 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  Patient stated that she continues to have gas but she was not able to have a bowel movement.  She had some nausea and multiple episodes of vomiting and not able to keep anything down.  She was concerned about recurrent bowel obstruction.  She denies any chest pain, fever, shortness of breath, palpitations, headache, dysuria, hematuria.  ED Course: Upon arrival to the ED, patient is found to be hypertensive at 193/116, hematocrit 35.8, creatinine 1.05, CT abdomen and pelvis showed dilated bowel loops concerning for obstruction.  Patient was given pain medication with Dilaudid , IV fluid, Zofran .  General surgery was consulted who advised to keep the patient in the hospital under hospitalist service and they will see the patient.  Even though patient had a surgery and complications at [facility], Dr. Tye of general surgery advised that patient could be managed at [facility].  Hospitalist service was consulted for evaluation for admission.  Review of Systems:  ROS  All other systems negative except as noted in the HPI.  Past Medical History:  Diagnosis Date   Cervical  cancer (HCC)    Hypertension     No past surgical history on file.   reports that she has never smoked. She has never used smokeless tobacco. She reports that she does not currently use alcohol. She reports that she does not use drugs.  Allergies[1]  No family history on file.  Prior to Admission medications  Medication Sig Start Date End Date Taking? Authorizing Provider  Chlorhexidine  Gluconate Cloth 2 % PADS Apply 6 each topically daily. 02/23/23   Alexander, Natalie, DO  HYDROcodone -acetaminophen  (NORCO/VICODIN) 5-325 MG tablet Take 1-2 tablets by mouth every 6 (six) hours as needed for moderate pain or severe pain. 02/22/23   Alexander, Natalie, DO  metoprolol  succinate (TOPROL -XL) 100 MG 24 hr tablet Take 100 mg by mouth daily. 12/20/21   [provider]  ondansetron  (ZOFRAN -ODT) 4 MG disintegrating tablet Take 1 tablet (4 mg total) by mouth every 8 (eight) hours as needed for nausea or vomiting. 04/04/24   Saunders Givens L, PA-C  oxyCODONE  (ROXICODONE ) 5 MG immediate release tablet Take 1 tablet (5 mg total) by mouth every 8 (eight) hours as needed. 04/25/24 04/25/25  Claudene Rover, MD  pantoprazole  (PROTONIX ) 40 MG tablet Take 1 tablet (40 mg total) by mouth daily. 04/04/24 05/04/24  Saunders Givens CROME, PA-C  spironolactone  (ALDACTONE ) 25 MG tablet Take 25 mg by mouth daily. 12/20/21   [provider]  Zinc  Oxide 40 % PSTE Apply 1 Application topically daily. To areas of skin irritation 02/22/23   Marsa Edelman, DO    Physical Exam: Vitals:   10/10/24 0603 10/10/24 9371 10/10/24 0630 10/10/24 0812  BP: ROLLEN)  180/90 (!) 178/96  (!) 188/97  Pulse: 72 68  63  Resp: 16 20  17   Temp: 98 F (36.7 C) 97.6 F (36.4 C)  98.2 F (36.8 C)  TempSrc: Oral   Oral  SpO2: 100% 99%  100%  Weight:   85.2 kg   Height:   5' 5 (1.651 m)     Physical Exam   Constitutional: Alert, awake, calm, comfortable HEENT: Neck supple Respiratory: Clear to auscultation B/L, no wheezing, no  rales.  Cardiovascular: Regular rate and rhythm, no murmurs / rubs / gallops. No extremity edema. 2+ pedal pulses. No carotid bruits.  Abdomen: Soft, mild to moderate diffuse tenderness, Bowel sounds positive.  Surgical scar and urostomy present Musculoskeletal: no clubbing / cyanosis. Good ROM, no contractures. Normal muscle tone.  Skin: no rashes, lesions, ulcers. Neurologic: CN 2-12 grossly intact. Sensation intact, No focal deficit identified Psychiatric: Alert and oriented x 3. Normal mood.    Labs on Admission: I have personally reviewed following labs and imaging studies  CBC: Recent Labs  Lab 10/10/24 0341  WBC 8.1  NEUTROABS 5.2  HGB 12.0  HCT 35.8*  MCV 90.2  PLT 313   Basic Metabolic Panel: Recent Labs  Lab 10/10/24 0341  NA 139  K 3.8  CL 101  CO2 24  GLUCOSE 138*  BUN 16  CREATININE 1.05*  CALCIUM 9.7   GFR: Estimated Creatinine Clearance: 66.8 mL/min (A) (by C-G formula based on SCr of 1.05 mg/dL (H)). Liver Function Tests: Recent Labs  Lab 10/10/24 0341  AST 20  ALT <5  ALKPHOS 119  BILITOT 0.7  PROT 8.3*  ALBUMIN 4.2   Recent Labs  Lab 10/10/24 0341  LIPASE 26   No results for input(s): AMMONIA in the last 168 hours. Coagulation Profile: No results for input(s): INR, PROTIME in the last 168 hours. Cardiac Enzymes: No results for input(s): CKTOTAL, CKMB, CKMBINDEX, TROPONINI, TROPONINIHS in the last 168 hours. BNP (last 3 results) No results for input(s): BNP in the last 8760 hours. HbA1C: No results for input(s): HGBA1C in the last 72 hours. CBG: No results for input(s): GLUCAP in the last 168 hours. Lipid Profile: No results for input(s): CHOL, HDL, LDLCALC, TRIG, CHOLHDL, LDLDIRECT in the last 72 hours. Thyroid Function Tests: No results for input(s): TSH, T4TOTAL, FREET4, T3FREE, THYROIDAB in the last 72 hours. Anemia Panel: No results for input(s): VITAMINB12, FOLATE, FERRITIN,  TIBC, IRON, RETICCTPCT in the last 72 hours. Urine analysis:    Component Value Date/Time   COLORURINE YELLOW (A) 04/25/2024 1030   APPEARANCEUR HAZY (A) 04/25/2024 1030   LABSPEC 1.012 04/25/2024 1030   PHURINE 8.0 04/25/2024 1030   GLUCOSEU NEGATIVE 04/25/2024 1030   HGBUR SMALL (A) 04/25/2024 1030   BILIRUBINUR NEGATIVE 04/25/2024 1030   KETONESUR NEGATIVE 04/25/2024 1030   PROTEINUR 100 (A) 04/25/2024 1030   NITRITE POSITIVE (A) 04/25/2024 1030   LEUKOCYTESUR MODERATE (A) 04/25/2024 1030    Radiological Exams on Admission: I have personally reviewed images CT ABDOMEN PELVIS W CONTRAST Result Date: 10/10/2024 EXAM: CT ABDOMEN AND PELVIS WITH CONTRAST 10/10/2024 04:50:05 AM TECHNIQUE: CT of the abdomen and pelvis was performed with the administration of 100 mL of iohexol  (OMNIPAQUE ) 300 MG/ML solution. Multiplanar reformatted images are provided for review. Automated exposure control, iterative reconstruction, and/or weight-based adjustment of the mA/kV was utilized to reduce the radiation dose to as low as reasonably achievable. COMPARISON: CT of the abdomen and pelvis dated 04/25/2024. CLINICAL HISTORY: Bowel obstruction suspected. FINDINGS:  LOWER CHEST: There are ground glass and reticular opacities present within the lung bases bilaterally. LIVER: The liver is unremarkable. GALLBLADDER AND BILE DUCTS: Gallbladder is unremarkable. No biliary ductal dilatation. SPLEEN: No acute abnormality. PANCREAS: No acute abnormality. ADRENAL GLANDS: No acute abnormality. KIDNEYS, URETERS AND BLADDER: The patient is status post cystectomy, hysterectomy and bilateral salpingo-oophorectomy and has a right lower quadrant ileal conduit. A left ureteral stent is again demonstrated. The collecting systems are nondilated. No stones in the kidneys or ureters. No hydronephrosis. No perinephric or periureteral stranding. GI AND BOWEL: Stomach demonstrates no acute abnormality. There is abnormal distention of  the small bowel in the left lower quadrant. There are several fluid-filled loops which measure up to 2.8 cm in diameter and there is abrupt transitioning to nondilated small bowel, which can be seen on image 40 of coronal series 5, presumably representing an adhesion. PERITONEUM AND RETROPERITONEUM: No ascites. No free air. VASCULATURE: Aorta is normal in caliber. LYMPH NODES: No lymphadenopathy. REPRODUCTIVE ORGANS: Status post hysterectomy and bilateral salpingo-oophorectomy. BONES AND SOFT TISSUES: There are mild-to-moderate degenerative changes within the lumbar spine. No acute osseous abnormality. No focal soft tissue abnormality. IMPRESSION: 1. Small bowel obstruction in the left lower quadrant with an abrupt transition point, presumably due to an adhesion. 2. Status post cystectomy, hysterectomy, and bilateral salpingo-oophorectomy with right lower quadrant ileal conduit and left ureteral stent in place. Collecting systems are nondilated. 3. Ground glass and reticular opacities in the lung bases bilaterally. Electronically signed by: Evalene Coho MD 10/10/2024 05:13 AM EST RP Workstation: [id number]    EKG: My personal interpretation of EKG shows:     Assessment/Plan Principal Problem:   SBO (small bowel obstruction) (HCC) Active Problems:   Chronic kidney disease, stage 2 (mild)   Essential hypertension   Obstructive sleep apnea (adult) (pediatric)   History of cervical cancer    Assessment and Plan: 53 year old female with history of cervical cancer, with history of recent partial SBO admitted at [facility], HTN, OSA who came into ED at [facility] complaining of abdominal pain nausea vomiting and constipation.  1.  Small bowel obstruction - It appears that she has small bowel obstruction. - She will be admitted to the hospital as inpatient, IV fluid, n.p.o., pain medications, NG tube. - Patient stated that she had a bowel movement just a moment ago. - Patient is requesting not to  put the NG tube as she started feeling better - She has a bowel sounds. - I will wait for general surgery to make the decision.  2.  HTN - Will give her home medications for blood pressure - Will give her hydralazine  as needed for systolic blood pressure more than 160 - Will continue to monitor blood pressure  3.  Obstructive sleep apnea - CPAP during the night  4.  Cervical cancer in remission - Follow-up as outpatient with oncologist     DVT prophylaxis: Lovenox  Code Status: Full Code Family Communication: Daughter was at bedside Disposition Plan: Home Consults called: General Surgery by ED physician Admission status: Inpatient, Med-Surg   Nena Rebel, MD [facility] 10/10/2024, 9:09 AM       [1]  Allergies Allergen Reactions   Irbesartan Swelling and Other (See Comments)   Amlodipine Swelling   "

## 2024-10-10 NOTE — ED Triage Notes (Addendum)
 Pt c/o abd pain that started yesterday associated with constipation and NV. Last BM PTA sts difficulty with straining. Pt reports concern for recurrent SBO.

## 2024-10-10 NOTE — Progress Notes (Signed)
 Anticoagulation monitoring(Lovenox ):  53 yo female ordered Lovenox  40 mg Q24h    Filed Weights   10/10/24 0355  Weight: 89.8 kg (198 lb)   BMI 33   Lab Results  Component Value Date   CREATININE 1.05 (H) 10/10/2024   CREATININE 0.94 04/25/2024   CREATININE 1.04 (H) 04/04/2024   Estimated Creatinine Clearance: 68.6 mL/min (A) (by C-G formula based on SCr of 1.05 mg/dL (H)). Hemoglobin & Hematocrit     Component Value Date/Time   HGB 12.0 10/10/2024 0341   HCT 35.8 (L) 10/10/2024 0341     Per Protocol for Patient with estCrcl > 30 ml/min and BMI > 30, will transition to Lovenox  45 mg Q24h.

## 2024-10-11 ENCOUNTER — Encounter: Payer: Self-pay | Admitting: Internal Medicine

## 2024-10-11 LAB — HIV ANTIBODY (ROUTINE TESTING W REFLEX): HIV Screen 4th Generation wRfx: NONREACTIVE

## 2024-10-11 MED ORDER — ACETAMINOPHEN 325 MG PO TABS
650.0000 mg | ORAL_TABLET | Freq: Four times a day (QID) | ORAL | Status: DC | PRN
  Administered 2024-10-11: 650 mg via ORAL
  Filled 2024-10-11: qty 2

## 2024-10-11 NOTE — Progress Notes (Signed)
 [facility]- General Surgery  SURGICAL PROGRESS NOTE  Hospital Day(s): 1.   Interval History:  Patient seen and examined. No acute events or new complaints overnight.  Abdominal x-ray from yesterday showed no evidence of small bowel obstruction.  Patient has had multiple bowel movements.  Has been tolerating clear liquid diet.  Denies any nausea or vomiting.  Denied any abdominal discomfort. Patient stated she had a flight this morning and needed to leave.  Vital signs in last 24 hours: [min-max] current  Temp:  [98.1 F (36.7 C)-98.9 F (37.2 C)] 98.1 F (36.7 C) (12/23 0516) Pulse Rate:  [56-59] 56 (12/23 0516) Resp:  [17-18] 18 (12/23 0516) BP: (131-177)/(65-97) 131/65 (12/23 0516) SpO2:  [97 %-100 %] 97 % (12/23 0516)     Height: 5' 5 (165.1 cm) Weight: 85.2 kg BMI (Calculated): 31.26   Intake/Output last 2 shifts:  12/22 0701 - 12/23 0700 In: -  Out: 150 [Urine:150]   Physical Exam:  Constitutional: alert, cooperative and no distress  Respiratory: breathing non-labored at rest  Cardiovascular: regular rate and sinus rhythm  Gastrointestinal: soft, non-tender, and non-distended, surgical scar from previous surgery  Labs:     Latest Ref Rng & Units 10/10/2024    3:41 AM 04/25/2024   10:00 AM 04/04/2024    1:12 PM  CBC  WBC 4.0 - 10.5 K/uL 8.1  4.9  4.4   Hemoglobin 12.0 - 15.0 g/dL 87.9  88.7  88.4   Hematocrit 36.0 - 46.0 % 35.8  33.6  35.3   Platelets 150 - 400 K/uL 313  309  281       Latest Ref Rng & Units 10/10/2024    3:41 AM 04/25/2024   10:00 AM 04/04/2024    1:12 PM  CMP  Glucose 70 - 99 mg/dL 861  99  90   BUN 6 - 20 mg/dL 16  22  18    Creatinine 0.44 - 1.00 mg/dL 8.94  9.05  8.95   Sodium 135 - 145 mmol/L 139  140  140   Potassium 3.5 - 5.1 mmol/L 3.8  3.4  3.5   Chloride 98 - 111 mmol/L 101  104  105   CO2 22 - 32 mmol/L 24  26  26    Calcium 8.9 - 10.3 mg/dL 9.7  9.3  9.0   Total Protein 6.5 - 8.1 g/dL 8.3  8.1  7.8   Total Bilirubin 0.0 - 1.2  mg/dL 0.7  0.8  0.8   Alkaline Phos 38 - 126 U/L 119  93  93   AST 15 - 41 U/L 20  15  16    ALT 0 - 44 U/L 5  8  8      Imaging studies:  EXAM: 1 VIEW XRAY OF THE ABDOMEN 10/10/2024 08:00:00 PM   COMPARISON: 10/10/2024   CLINICAL HISTORY:   FINDINGS:   BOWEL: Enteric contrast is present throughout the colon. No evidence of small bowel obstruction. Nonobstructive bowel gas pattern.   SOFT TISSUES: Stent within the right lower quadrant ileal conduit. No abnormal calcifications.   BONES: No acute fracture.   IMPRESSION: 1. No evidence of small bowel obstruction.   Electronically signed by: Norman Gatlin MD 10/10/2024 08:11 PM EST RP Workstation: [id number]  Assessment/Plan:  53 y.o. female with small bowel obstruction, complicated by pertinent comorbidities including anemia, essential hypertension, CKD stage II, and history of cervical cancer.   - Patient overall appears comfortable on exam.  Has had returned of GI function after  3 days of no bowel movement. Abdominal x-ray reassuring for no small bowel obstruction.  Patient seems to be tolerating clear liquid diet.  Plan was to advance as tolerated. However patient decided to leave AMA.  Paperwork was signed and on file.  Hospitalist was made aware.  -- Gilmer Cea PA-C

## 2024-10-11 NOTE — Discharge Summary (Signed)
 " Physician Discharge Summary   Patient: Cassandra Russo MRN: [id number] DOB: 1971/04/13  Admit date:     10/10/2024  Discharge date: 10/11/2024  Discharge Physician: Amaryllis Dare   PCP: Marcelino Squires, MD   Recommendations at discharge:  Patient left AMA before seen  Discharge Diagnoses: Principal Problem:   SBO (small bowel obstruction) (HCC) Active Problems:   Chronic kidney disease, stage 2 (mild)   Essential hypertension   Obstructive sleep apnea (adult) (pediatric)   History of cervical cancer  Resolved Problems:   * No resolved hospital problems. [facility] Course: Partly taken from H&P.  Cassandra Russo is a pleasant 53 y.o. 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  She was passing flatus but no BM.  Also has some nausea and vomiting and unable to keep anything down.  On presentation patient was hypertensive at 193/116.  CT abdomen and pelvis with dilated bowel loops concerning for obstruction.  General surgery was consulted and they advised admitting patient under hospitalist service and they will consult.  12/23: Vital stable, patient had multiple bowel movements overnight and was tolerating clear liquid diet.  She was seen by general surgery and advised to slowly advance diet but patient was in a rush to catch a flight earlier in the morning and left AMA before she was seen by me.   Consultants: General surgery Procedures performed: None Disposition: Patient left AMA Diet recommendation:   DISCHARGE MEDICATION:   Discharge  Exam: Filed Weights   10/10/24 0355 10/10/24 0630  Weight: 89.8 kg 85.2 kg   Patient left AMA before seen  Condition at discharge: Patient was never seen by me as she left AMA earlier in the morning  The results of significant diagnostics from this hospitalization (including imaging, microbiology, ancillary and laboratory) are listed below for reference.   Imaging Studies: DG Abd Portable 1V-Small Bowel Obstruction Protocol-initial, 8 hr delay Result Date: 10/10/2024 EXAM: 1 VIEW XRAY OF THE ABDOMEN 10/10/2024 08:00:00 PM COMPARISON: 10/10/2024 CLINICAL HISTORY: FINDINGS: BOWEL: Enteric contrast is present throughout the colon. No evidence of small bowel obstruction. Nonobstructive bowel gas pattern. SOFT TISSUES: Stent within the right lower quadrant ileal conduit. No abnormal calcifications. BONES: No acute fracture. IMPRESSION: 1. No evidence of small bowel obstruction. Electronically signed by: Norman Gatlin MD 10/10/2024 08:11 PM EST RP Workstation: [id number]   CT ABDOMEN PELVIS W CONTRAST Result Date: 10/10/2024 EXAM: CT ABDOMEN AND PELVIS WITH CONTRAST 10/10/2024 04:50:05 AM TECHNIQUE: CT of the abdomen and pelvis was performed with the administration of 100 mL of iohexol  (OMNIPAQUE ) 300 MG/ML solution. Multiplanar reformatted images are provided for review. Automated exposure control, iterative reconstruction, and/or weight-based adjustment of the mA/kV was utilized to reduce the radiation dose to as low as reasonably achievable. COMPARISON: CT of the abdomen and pelvis dated 04/25/2024. CLINICAL HISTORY: Bowel obstruction suspected. FINDINGS: LOWER CHEST: There are ground glass and reticular opacities present within the lung bases bilaterally. LIVER: The liver is unremarkable. GALLBLADDER AND BILE DUCTS: Gallbladder is unremarkable. No biliary ductal dilatation. SPLEEN: No acute abnormality. PANCREAS: No acute abnormality. ADRENAL GLANDS: No acute abnormality. KIDNEYS, URETERS AND  BLADDER: The  patient is status post cystectomy, hysterectomy and bilateral salpingo-oophorectomy and has a right lower quadrant ileal conduit. A left ureteral stent is again demonstrated. The collecting systems are nondilated. No stones in the kidneys or ureters. No hydronephrosis. No perinephric or periureteral stranding. GI AND BOWEL: Stomach demonstrates no acute abnormality. There is abnormal distention of the small bowel in the left lower quadrant. There are several fluid-filled loops which measure up to 2.8 cm in diameter and there is abrupt transitioning to nondilated small bowel, which can be seen on image 40 of coronal series 5, presumably representing an adhesion. PERITONEUM AND RETROPERITONEUM: No ascites. No free air. VASCULATURE: Aorta is normal in caliber. LYMPH NODES: No lymphadenopathy. REPRODUCTIVE ORGANS: Status post hysterectomy and bilateral salpingo-oophorectomy. BONES AND SOFT TISSUES: There are mild-to-moderate degenerative changes within the lumbar spine. No acute osseous abnormality. No focal soft tissue abnormality. IMPRESSION: 1. Small bowel obstruction in the left lower quadrant with an abrupt transition point, presumably due to an adhesion. 2. Status post cystectomy, hysterectomy, and bilateral salpingo-oophorectomy with right lower quadrant ileal conduit and left ureteral stent in place. Collecting systems are nondilated. 3. Ground glass and reticular opacities in the lung bases bilaterally. Electronically signed by: Evalene Coho MD 10/10/2024 05:13 AM EST RP Workstation: [id number]    Microbiology: Results for orders placed or performed during the hospital encounter of 04/25/24  Urine Culture     Status: Abnormal   Collection Time: 04/25/24 10:30 AM   Specimen: Urine, Bag (ped)  Result Value Ref Range Status   Specimen Description   Final    URINE, BAG PED Performed at [facility], [address], [place], [place] [address]    Special Requests   Final     NONE Performed at [facility], [address] [place], [place] [address]    Culture MULTIPLE SPECIES PRESENT, SUGGEST RECOLLECTION (A)  Final   Report Status 04/26/2024 FINAL  Final    Labs: CBC: Recent Labs  Lab 10/10/24 0341  WBC 8.1  NEUTROABS 5.2  HGB 12.0  HCT 35.8*  MCV 90.2  PLT 313   Basic Metabolic Panel: Recent Labs  Lab 10/10/24 0341  NA 139  K 3.8  CL 101  CO2 24  GLUCOSE 138*  BUN 16  CREATININE 1.05*  CALCIUM 9.7   Liver Function Tests: Recent Labs  Lab 10/10/24 0341  AST 20  ALT <5  ALKPHOS 119  BILITOT 0.7  PROT 8.3*  ALBUMIN 4.2   CBG: No results for input(s): GLUCAP in the last 168 hours.  Discharge time spent:  30 minutes.  Signed: Amaryllis Dare, MD [facility] 10/11/2024 "

## 2024-10-11 NOTE — Consult Note (Signed)
 Subjective:   CC: Small bowel obstruction  HPI:  Cassandra Russo is a 53 y.o. female who is consulted by [facility] for evaluation of above cc.  Symptoms were first noted 2 days ago. Pain is sharp, associated with bloating, exacerbated by nothing specific.  History of bowel obstruction in the past requiring surgical intervention.     Past Medical History:  has a past medical history of Cervical cancer (HCC) and Hypertension.  Past Surgical History:  has no past surgical history on file.  Family History: family history is not on file.  Social History:  reports that she has never smoked. She has never used smokeless tobacco. She reports that she does not currently use alcohol. She reports that she does not use drugs.  Current Medications:  Prior to Admission medications  Medication Sig Start Date End Date Taking? Authorizing Provider  Chlorhexidine  Gluconate Cloth 2 % PADS Apply 6 each topically daily. 02/23/23   Alexander, Natalie, DO  HYDROcodone -acetaminophen  (NORCO/VICODIN) 5-325 MG tablet Take 1-2 tablets by mouth every 6 (six) hours as needed for moderate pain or severe pain. 02/22/23   Alexander, Natalie, DO  metoprolol  succinate (TOPROL -XL) 100 MG 24 hr tablet Take 100 mg by mouth daily. 12/20/21   [provider]  ondansetron  (ZOFRAN -ODT) 4 MG disintegrating tablet Take 1 tablet (4 mg total) by mouth every 8 (eight) hours as needed for nausea or vomiting. 04/04/24   Saunders Givens L, PA-C  oxyCODONE  (ROXICODONE ) 5 MG immediate release tablet Take 1 tablet (5 mg total) by mouth every 8 (eight) hours as needed. 04/25/24 04/25/25  Claudene Rover, MD  pantoprazole  (PROTONIX ) 40 MG tablet Take 1 tablet (40 mg total) by mouth daily. 04/04/24 05/04/24  Saunders Givens CROME, PA-C  spironolactone  (ALDACTONE ) 25 MG tablet Take 25 mg by mouth daily. 12/20/21   [provider]  Zinc  Oxide 40 % PSTE Apply 1 Application topically daily. To areas of skin irritation 02/22/23   Alexander, Natalie, DO     Allergies:  Allergies as of 10/10/2024 - Review Complete 10/10/2024  Allergen Reaction Noted   Irbesartan Swelling and Other (See Comments) 11/05/2019   Amlodipine Swelling 06/11/2018    ROS:  Pertinent positives and negatives noted in HPI   Objective:     BP 131/65 (BP Location: Left Arm)   Pulse (!) 56   Temp 98.1 F (36.7 C) (Oral)   Resp 18   Ht 5' 5 (1.651 m)   Wt 85.2 kg   SpO2 97%   BMI 31.26 kg/m    Constitutional :  alert, cooperative, appears stated age, and no distress  Respiratory:  Clear to auscultation bilaterally  Cardiovascular:  Regular rate and rhythm  Gastrointestinal: soft, non-tender; bowel sounds normal; no masses,  no organomegaly.   Skin: Cool and moist  Psychiatric: Normal affect, non-agitated, not confused       LABS:     Latest Ref Rng & Units 10/10/2024    3:41 AM 04/25/2024   10:00 AM 04/04/2024    1:12 PM  CMP  Glucose 70 - 99 mg/dL 861  99  90   BUN 6 - 20 mg/dL 16  22  18    Creatinine 0.44 - 1.00 mg/dL 8.94  9.05  8.95   Sodium 135 - 145 mmol/L 139  140  140   Potassium 3.5 - 5.1 mmol/L 3.8  3.4  3.5   Chloride 98 - 111 mmol/L 101  104  105   CO2 22 - 32 mmol/L 24  26  26   Calcium 8.9 - 10.3 mg/dL 9.7  9.3  9.0   Total Protein 6.5 - 8.1 g/dL 8.3  8.1  7.8   Total Bilirubin 0.0 - 1.2 mg/dL 0.7  0.8  0.8   Alkaline Phos 38 - 126 U/L 119  93  93   AST 15 - 41 U/L 20  15  16    ALT 0 - 44 U/L 5  8  8        Latest Ref Rng & Units 10/10/2024    3:41 AM 04/25/2024   10:00 AM 04/04/2024    1:12 PM  CBC  WBC 4.0 - 10.5 K/uL 8.1  4.9  4.4   Hemoglobin 12.0 - 15.0 g/dL 87.9  88.7  88.4   Hematocrit 36.0 - 46.0 % 35.8  33.6  35.3   Platelets 150 - 400 K/uL 313  309  281      RADS: EXAM: 1 VIEW XRAY OF THE ABDOMEN 10/10/2024 08:00:00 PM   COMPARISON: 10/10/2024   CLINICAL HISTORY:   FINDINGS:   BOWEL: Enteric contrast is present throughout the colon. No evidence of small bowel obstruction. Nonobstructive bowel gas  pattern.   SOFT TISSUES: Stent within the right lower quadrant ileal conduit. No abnormal calcifications.   BONES: No acute fracture.   IMPRESSION: 1. No evidence of small bowel obstruction.   Electronically signed by: Norman Gatlin MD 10/10/2024 08:11 PM EST RP Workstation: [id number]  Assessment:   SBO, partial  Plan:   Patient verbalized that she was already having bowel movements after small bowel follow-through was ordered.  Feeling better at the time of exam.  Recommend advancing diet as tolerated   labs/images/medications/previous chart entries reviewed personally and relevant changes/updates noted above.

## 2024-10-11 NOTE — Plan of Care (Signed)

## 2024-10-11 NOTE — Hospital Course (Addendum)
 Partly taken from H&P.  Cassandra Russo is a pleasant 53 y.o. 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  She was passing flatus but no BM.  Also has some nausea and vomiting and unable to keep anything down.  On presentation patient was hypertensive at 193/116.  CT abdomen and pelvis with dilated bowel loops concerning for obstruction.  General surgery was consulted and they advised admitting patient under hospitalist service and they will consult.  12/23: Vital stable, patient had multiple bowel movements overnight and was tolerating clear liquid diet.  She was seen by general surgery and advised to slowly advance diet but patient was in a rush to catch a flight earlier in the morning and left AMA before she was seen by me.

## 2024-10-11 NOTE — Progress Notes (Signed)
 Please see note in chart by me for further explanation

## 2024-10-11 NOTE — Care Management (Signed)
 Pt notified nursing staff she wanted to leave AMA. Pt educated on risks of leaving AMA. Paperwork signed & on file, MD aware. New EPIC AMA protocol not yet available in [email or web address] verified by preceptor & charge nurse.
